# Patient Record
Sex: Male | Born: 1950 | Race: White | Hispanic: No | Marital: Married | Smoking: Never smoker
Health system: Southern US, Community
[De-identification: ages and names within clinical notes are randomized; demographics above are authoritative.]

## PROBLEM LIST (undated history)

## (undated) DIAGNOSIS — I10 Essential (primary) hypertension: Secondary | ICD-10-CM

## (undated) DIAGNOSIS — K5792 Diverticulitis of intestine, part unspecified, without perforation or abscess without bleeding: Secondary | ICD-10-CM

## (undated) DIAGNOSIS — I219 Acute myocardial infarction, unspecified: Secondary | ICD-10-CM

## (undated) HISTORY — PX: CARDIAC SURGERY: SHX584

---

## 2019-08-26 ENCOUNTER — Encounter (HOSPITAL_BASED_OUTPATIENT_CLINIC_OR_DEPARTMENT_OTHER): Payer: Self-pay | Admitting: Emergency Medicine

## 2019-08-26 ENCOUNTER — Emergency Department (HOSPITAL_BASED_OUTPATIENT_CLINIC_OR_DEPARTMENT_OTHER): Payer: BLUE CROSS/BLUE SHIELD

## 2019-08-26 ENCOUNTER — Other Ambulatory Visit: Payer: Self-pay

## 2019-08-26 ENCOUNTER — Emergency Department (HOSPITAL_BASED_OUTPATIENT_CLINIC_OR_DEPARTMENT_OTHER)
Admission: EM | Admit: 2019-08-26 | Discharge: 2019-08-27 | Discharge status: Against Medical Advice | Payer: BLUE CROSS/BLUE SHIELD | Attending: Internal Medicine | Admitting: Internal Medicine

## 2019-08-26 DIAGNOSIS — R202 Paresthesia of skin: Secondary | ICD-10-CM | POA: Diagnosis not present

## 2019-08-26 DIAGNOSIS — R531 Weakness: Secondary | ICD-10-CM | POA: Diagnosis not present

## 2019-08-26 DIAGNOSIS — Z20822 Contact with and (suspected) exposure to covid-19: Secondary | ICD-10-CM | POA: Diagnosis not present

## 2019-08-26 DIAGNOSIS — H538 Other visual disturbances: Secondary | ICD-10-CM | POA: Insufficient documentation

## 2019-08-26 DIAGNOSIS — Z5321 Procedure and treatment not carried out due to patient leaving prior to being seen by health care provider: Secondary | ICD-10-CM | POA: Insufficient documentation

## 2019-08-26 DIAGNOSIS — R2990 Unspecified symptoms and signs involving the nervous system: Secondary | ICD-10-CM

## 2019-08-26 DIAGNOSIS — I639 Cerebral infarction, unspecified: Secondary | ICD-10-CM | POA: Diagnosis present

## 2019-08-26 HISTORY — DX: Acute myocardial infarction, unspecified: I21.9

## 2019-08-26 LAB — DIFFERENTIAL
Abs Immature Granulocytes: 0.03 10*3/uL (ref 0.00–0.07)
Basophils Absolute: 0.1 10*3/uL (ref 0.0–0.1)
Basophils Relative: 1 %
Eosinophils Absolute: 0.2 10*3/uL (ref 0.0–0.5)
Eosinophils Relative: 3 %
Immature Granulocytes: 0 %
Lymphocytes Relative: 14 %
Lymphs Abs: 1 10*3/uL (ref 0.7–4.0)
Monocytes Absolute: 0.8 10*3/uL (ref 0.1–1.0)
Monocytes Relative: 11 %
Neutro Abs: 5 10*3/uL (ref 1.7–7.7)
Neutrophils Relative %: 71 %

## 2019-08-26 LAB — RAPID URINE DRUG SCREEN, HOSP PERFORMED
Amphetamines: NOT DETECTED
Barbiturates: NOT DETECTED
Benzodiazepines: NOT DETECTED
Cocaine: NOT DETECTED
Opiates: NOT DETECTED
Tetrahydrocannabinol: NOT DETECTED

## 2019-08-26 LAB — CBC
HCT: 38.9 % — ABNORMAL LOW (ref 39.0–52.0)
Hemoglobin: 12.7 g/dL — ABNORMAL LOW (ref 13.0–17.0)
MCH: 27.1 pg (ref 26.0–34.0)
MCHC: 32.6 g/dL (ref 30.0–36.0)
MCV: 83.1 fL (ref 80.0–100.0)
Platelets: 238 10*3/uL (ref 150–400)
RBC: 4.68 MIL/uL (ref 4.22–5.81)
RDW: 15.2 % (ref 11.5–15.5)
WBC: 7.1 10*3/uL (ref 4.0–10.5)
nRBC: 0 % (ref 0.0–0.2)

## 2019-08-26 LAB — URINALYSIS, ROUTINE W REFLEX MICROSCOPIC
Bilirubin Urine: NEGATIVE
Glucose, UA: NEGATIVE mg/dL
Hgb urine dipstick: NEGATIVE
Ketones, ur: NEGATIVE mg/dL
Leukocytes,Ua: NEGATIVE
Nitrite: NEGATIVE
Protein, ur: NEGATIVE mg/dL
Specific Gravity, Urine: 1.025 (ref 1.005–1.030)
pH: 6 (ref 5.0–8.0)

## 2019-08-26 LAB — COMPREHENSIVE METABOLIC PANEL
ALT: 32 U/L (ref 0–44)
AST: 34 U/L (ref 15–41)
Albumin: 3.6 g/dL (ref 3.5–5.0)
Alkaline Phosphatase: 159 U/L — ABNORMAL HIGH (ref 38–126)
Anion gap: 9 (ref 5–15)
BUN: 15 mg/dL (ref 8–23)
CO2: 25 mmol/L (ref 22–32)
Calcium: 8.9 mg/dL (ref 8.9–10.3)
Chloride: 103 mmol/L (ref 98–111)
Creatinine, Ser: 0.8 mg/dL (ref 0.61–1.24)
GFR calc Af Amer: >60 mL/min (ref >60)
GFR calc non Af Amer: >60 mL/min (ref >60)
Glucose, Bld: 115 mg/dL — ABNORMAL HIGH (ref 70–99)
Potassium: 3.5 mmol/L (ref 3.5–5.1)
Sodium: 137 mmol/L (ref 135–145)
Total Bilirubin: 2.7 mg/dL — ABNORMAL HIGH (ref 0.3–1.2)
Total Protein: 7.3 g/dL (ref 6.5–8.1)

## 2019-08-26 LAB — PROTIME-INR
INR: 1.5 — ABNORMAL HIGH (ref 0.8–1.2)
Prothrombin Time: 17.1 seconds — ABNORMAL HIGH (ref 11.4–15.2)

## 2019-08-26 LAB — SARS CORONAVIRUS 2 BY RT PCR (HOSPITAL ORDER, PERFORMED IN ~~LOC~~ HOSPITAL LAB): SARS Coronavirus 2: NEGATIVE

## 2019-08-26 LAB — ETHANOL: Alcohol, Ethyl (B): <10 mg/dL (ref <10)

## 2019-08-26 LAB — CBG MONITORING, ED: Glucose-Capillary: 114 mg/dL — ABNORMAL HIGH (ref 70–99)

## 2019-08-26 LAB — APTT: aPTT: 41 seconds — ABNORMAL HIGH (ref 24–36)

## 2019-08-26 MED ORDER — IOHEXOL 350 MG/ML SOLN
100.0000 mL | Freq: Once | INTRAVENOUS | Status: AC | PRN
  Administered 2019-08-26: 100 mL via INTRAVENOUS

## 2019-08-26 NOTE — ED Provider Notes (Signed)
Emergency Department Provider Note   I have reviewed the triage vital signs and the nursing notes.   HISTORY  Chief Complaint Numbness and Blurred Vision   HPI Tommy Brooks is a 69 y.o. male with PMH reviewed below presents to the ED with stroke like symptoms. He presents to the ED POV with wife. LSN at 5 PM yesterday.  Patient states that symptoms seem to happen gradually yesterday evening.  He felt numbness in his face worse on the left with vision change in his left eye.  He describes a blurry/double type vision in the left.  Patient states that before going to bed he had some difficulty walking but did not appreciate any clear weakness on the left although was feeling abnormal.  He went to bed around midnight.  This morning when he woke up he could essentially not walk.  He tried walking and was having to go from side to side in the hallway and nearly fell down.  The wife states that his voice also seems slightly different than before.  Patient states that since this morning his symptoms have improved slightly but remain present.  He is not having headache.  He has no prior history of stroke.  He has been vaccinated for COVID and is not having any respiratory symptoms.  Past Medical History:  Diagnosis Date  . MI (myocardial infarction) [facility])     Patient Active Problem List   Diagnosis Date Noted  . Acute cerebrovascular accident (CVA) (HCC) 08/26/2019   Allergies Patient has no known allergies.  No family history on file.  Social History Social History   Tobacco Use  . Smoking status: Not on file  Substance Use Topics  . Alcohol use: Not on file  . Drug use: Not on file    Review of Systems  Constitutional: No fever/chills Eyes: Positive left eye visual changes. ENT: No sore throat. Cardiovascular: Denies chest pain. Respiratory: Denies shortness of breath. Gastrointestinal: No abdominal pain.  No nausea, no vomiting.  No diarrhea.  No  constipation. Genitourinary: Negative for dysuria. Musculoskeletal: Negative for back pain. Skin: Negative for rash. Neurological: Negative for headaches. Positive left face numbness with LUE and LLE weakness.   10-point ROS otherwise negative.  ____________________________________________   PHYSICAL EXAM:  VITAL SIGNS: ED Triage Vitals  Enc Vitals Group     BP 08/26/19 1607 126/90     Pulse Rate 08/26/19 1607 85     Resp 08/26/19 1607 20     Temp --      Temp src --      SpO2 08/26/19 1607 98 %     Weight 08/26/19 1608 (!) 301 lb 5.9 oz (136.7 kg)     Height 08/26/19 1608 5\' 10"  (1.778 m)   Constitutional: Alert and oriented. Well appearing and in no acute distress. Eyes: Conjunctivae are normal. PERRL.  Patient has visual field deficit in the left lateral vision with left lateral eye movement becoming more staccato.  No clear nystagmus but extraocular movement is not smooth.  Head: Atraumatic. Nose: No congestion/rhinnorhea. Mouth/Throat: Mucous membranes are moist.  Neck: No stridor.   Cardiovascular: Normal rate, regular rhythm. Good peripheral circulation. Grossly normal heart sounds.   Respiratory: Normal respiratory effort.  No retractions. Lungs CTAB. Gastrointestinal: Soft and nontender. No distention.  Musculoskeletal: No lower extremity tenderness nor edema. No gross deformities of extremities. Neurologic:  Normal speech and language.  Subjective decreased sensation in the left face compared to the right.  Patient  has 4 out of 5 grip strength in the left with very mild left upper extremity drift.  Left lower extremity is 4+ out of 5 strength with normal sensation.  Finger-to-nose testing is abnormal with both eyes open but when the patient closes his left eye he has smooth finger-to-nose movement with both upper extremities along with normal heel-to-shin testing bilaterally Skin:  Skin is warm, dry and intact. No rash  noted.   ____________________________________________   LABS (all labs ordered are listed, but only abnormal results are displayed)  Labs Reviewed  PROTIME-INR - Abnormal; Notable for the following components:      Result Value   Prothrombin Time 17.1 (*)    INR 1.5 (*)    All other components within normal limits  APTT - Abnormal; Notable for the following components:   aPTT 41 (*)    All other components within normal limits  CBC - Abnormal; Notable for the following components:   Hemoglobin 12.7 (*)    HCT 38.9 (*)    All other components within normal limits  COMPREHENSIVE METABOLIC PANEL - Abnormal; Notable for the following components:   Glucose, Bld 115 (*)    Alkaline Phosphatase 159 (*)    Total Bilirubin 2.7 (*)    All other components within normal limits  CBG MONITORING, ED - Abnormal; Notable for the following components:   Glucose-Capillary 114 (*)    All other components within normal limits  SARS CORONAVIRUS 2 BY RT PCR (HOSPITAL ORDER, PERFORMED IN [facility])  ETHANOL  DIFFERENTIAL  RAPID URINE DRUG SCREEN, HOSP PERFORMED  URINALYSIS, ROUTINE W REFLEX MICROSCOPIC   ____________________________________________  EKG   EKG Interpretation  Date/Time:  Wednesday August 26 2019 16:08:50 EDT Ventricular Rate:  95 PR Interval:    QRS Duration: 155 QT Interval:  407 QTC Calculation: 512 R Axis:   65 Text Interpretation: Atrial fibrillation Right bundle branch block No old tracing for comparison. No STEMI Confirmed by Alona Bene [id number]) on 08/26/2019 4:29:11 PM Also confirmed by Alona Bene [phone]), editor [place], LaVerne ([id number])  on 08/27/2019 9:31:02 AM       ____________________________________________  RADIOLOGY  CTA head/neck reviewed.  ____________________________________________   PROCEDURES  Procedure(s) performed:   Procedures  CRITICAL CARE Performed by: Maia Plan Total critical care time: 35  minutes Critical care time was exclusive of separately billable procedures and treating other patients. Critical care was necessary to treat or prevent imminent or life-threatening deterioration. Critical care was time spent personally by me on the following activities: development of treatment plan with patient and/or surrogate as well as nursing, discussions with consultants, evaluation of patient's response to treatment, examination of patient, obtaining history from patient or surrogate, ordering and performing treatments and interventions, ordering and review of laboratory studies, ordering and review of radiographic studies, pulse oximetry and re-evaluation of patient's condition.  Alona Bene, MD Emergency Medicine  ____________________________________________   INITIAL IMPRESSION / ASSESSMENT AND PLAN / ED COURSE  Pertinent labs & imaging results that were available during my care of the patient were reviewed by me and considered in my medical decision making (see chart for details).   Patient presents to the emergency department for evaluation 23 hours after last seen normal with strokelike symptoms.  Patient is having mainly left-sided deficits with a vision change on the left.  He is outside of the tPA window. Will send for urgent CTA head/neck along with stroke labs.   CTA head and neck reviewed. No  acute findings.   Discussed patient's case with [facility] to request admission. Patient and family (if present) updated with plan. Care transferred to [facility] service.  I reviewed all nursing notes, vitals, pertinent old records, EKGs, labs, imaging (as available).  ____________________________________________  FINAL CLINICAL IMPRESSION(S) / ED DIAGNOSES  Final diagnoses:  Stroke-like symptoms     MEDICATIONS GIVEN DURING THIS VISIT:  Medications  iohexol (OMNIPAQUE) 350 MG/ML injection 100 mL (100 mLs Intravenous Contrast Given 08/26/19 1707)     Note:  This document was prepared  using Dragon voice recognition software and may include unintentional dictation errors.  Alona Bene, MD, [facility] Emergency Medicine    Duwayne Matters, Arlyss Repress, MD 08/31/19 [phone]

## 2019-08-26 NOTE — ED Notes (Signed)
Tele neurologist at bedside with this RN

## 2019-08-26 NOTE — Consult Note (Signed)
[facility] TeleNeurology Consult Services  Stat Consult  Date of Service:   08/26/2019 17:48:22  Impression:     .  I63.9 - Cerebrovascular accident (CVA), unspecified mechanism (HCC)  Comments/Sign-Out: 69 y/o man presenting with multiple symptoms concerning for stroke in setting of subtherapeutic INR (has afib and artificial valves). Recommend MRI to help assess size of stroke which will guide recs on whether warfarin needs to be held temporarily.  CT HEAD: Reviewed left basal ganglia hypodensity  Metrics: [facility] Notification Time: 08/26/2019 17:46:32 Stamp Time: 08/26/2019 17:48:22 Callback Response Time: 08/26/2019 17:49:03  Our recommendations are outlined below.  Imaging Studies:     .  MRI Head Without Contrast  Therapies:     .  Physical Therapy, Occupational Therapy, Speech Therapy Assessment When Applicable  Disposition: Neurology Follow Up Recommended  Sign Out:     .  Discussed with Emergency Department Provider  ----------------------------------------------------------------------------------------------------  Chief Complaint: left-sided weakness, vision loss, dizziness  History of Present Illness: Patient is a 69 year old Male.  Last night he noticed sudden onset of trouble standing, whole face went numb and couldn't see out of his left eye and left side felt weak and he was feeling dizzy and having trouble understanding what his wife was saying to him. His wife noticed his speech was slurred. When he got up today he was able to walk a little better but still having some numbness in his face and still can't see well out of his left eye. Has some vertigo and diplopia when he opens his left eye. INR is subtherapeutic at 1.5 today.    Past Medical History:     . Hypertension     . Atrial Fibrillation     . Coronary Artery Disease     . There is NO history of Diabetes Mellitus     . There is NO history of Hyperlipidemia      . There is NO history of Stroke  Anticoagulant use:  warfarin  Antiplatelet use: No     Examination: BP(124/93), Pulse(64), Blood Glucose(115) 1A: Level of Consciousness - Alert; keenly responsive + 0 1B: Ask Month and Age - Both Questions Right + 0 1C: Blink Eyes & Squeeze Hands - Performs Both Tasks + 0 2: Test Horizontal Extraocular Movements - Normal + 0 3: Test Visual Fields - Complete Hemianopia + 2 4: Test Facial Palsy (Use Grimace if Obtunded) - Normal symmetry + 0 5A: Test Left Arm Motor Drift - No Drift for 10 Seconds + 0 5B: Test Right Arm Motor Drift - No Drift for 10 Seconds + 0 6A: Test Left Leg Motor Drift - No Drift for 5 Seconds + 0 6B: Test Right Leg Motor Drift - No Drift for 5 Seconds + 0 7: Test Limb Ataxia (FNF/Heel-Shin) - No Ataxia + 0 8: Test Sensation - Mild-Moderate Loss: Less Sharp/More Dull + 1 9: Test Language/Aphasia - Normal; No aphasia + 0 10: Test Dysarthria - Normal + 0 11: Test Extinction/Inattention - No abnormality + 0  NIHSS Score: 3   Patient/Family was informed the Neurology Consult would occur via TeleHealth consult by way of interactive audio and video telecommunications and consented to receiving care in this manner.  Patient is being evaluated for possible acute neurologic impairment and high probability of imminent or life-threatening deterioration. I spent total of 25 minutes providing care to this patient, including time for face to face visit via telemedicine, review of medical records, imaging studies and discussion of findings  with providers, the patient and/or family.   Dr Sherilyn Cooter   [facility] [phone]  Case [id number]

## 2019-08-26 NOTE — ED Notes (Signed)
Patient transported to CT 

## 2019-08-26 NOTE — ED Triage Notes (Addendum)
PT states midnight, left face numbness, now entire face is numb . Left eye blurred and poor movement. Weakness on left side. MD in room. Last well know time 1700 yesterday. Per wife.

## 2019-08-26 NOTE — ED Notes (Signed)
Pt has decreased strength in left leg and arm. States entire face feels numb. No facial palsy. Pt unable to see peripheral vision in left eye, right side intact.

## 2019-08-26 NOTE — ED Notes (Signed)
ED Provider at bedside. 

## 2019-08-27 MED ORDER — RAMIPRIL 5 MG PO CAPS
5.0000 mg | ORAL_CAPSULE | Freq: Every day | ORAL | Status: DC
  Administered 2019-08-27: 5 mg via ORAL
  Filled 2019-08-27: qty 1

## 2019-08-27 MED ORDER — ALLOPURINOL 300 MG PO TABS
300.0000 mg | ORAL_TABLET | Freq: Every day | ORAL | Status: DC
  Filled 2019-08-27: qty 1

## 2019-08-27 MED ORDER — ATORVASTATIN CALCIUM 40 MG PO TABS
80.0000 mg | ORAL_TABLET | Freq: Every day | ORAL | Status: DC
  Administered 2019-08-27: 80 mg via ORAL

## 2019-08-27 MED ORDER — WARFARIN - PHYSICIAN DOSING INPATIENT
Freq: Every day | Status: DC
  Filled 2019-08-27: qty 1

## 2019-08-27 MED ORDER — CARVEDILOL 6.25 MG PO TABS
3.1250 mg | ORAL_TABLET | Freq: Two times a day (BID) | ORAL | Status: DC
  Administered 2019-08-27: 3.125 mg via ORAL

## 2019-08-27 MED ORDER — WARFARIN SODIUM 5 MG PO TABS
8.0000 mg | ORAL_TABLET | Freq: Every day | ORAL | Status: DC
  Administered 2019-08-27: 6 mg via ORAL

## 2019-08-27 MED ORDER — BUMETANIDE 2 MG PO TABS
2.0000 mg | ORAL_TABLET | Freq: Every day | ORAL | Status: DC
  Filled 2019-08-27: qty 1

## 2019-08-27 NOTE — ED Notes (Signed)
Pt insisting that he go home.  Dr. Deretha Emory notified and he has spoken to patient regarding AMA status.  Pt left department with wife, ambulated well.

## 2019-08-27 NOTE — ED Provider Notes (Signed)
Patient wants to leave AMA.  Explained to him earlier the significant risk of leaving when there is concern for stroke.  There is a very high risk for recurrent stroke or worsening symptoms to occur.  And admissions normally so that she can be monitored in case that happens in the hospital where he may not realize it is happening.  That he can receive immediate treatment.  Patient was admitted and awaiting admission for stroke concerns.  Patient understands all this and still wants to leave AMA.   Vanetta Mulders, MD 08/27/19 1408

## 2021-07-26 IMAGING — CT CT ANGIO NECK
1 of 14 series · 3 of 33 positions shown · IV contrast (omnipaque)
Comparison: None.

CLINICAL DATA: Left face numbness. Left eye blurred vision.
Left-sided weakness.

EXAM:
CT ANGIOGRAPHY HEAD AND NECK
TECHNIQUE: Multidetector CT imaging of the head and neck was performed using
the standard protocol during bolus administration of intravenous
contrast. Multiplanar CT image reconstructions and MIPs were
obtained to evaluate the vascular anatomy. Carotid stenosis
measurements (when applicable) are obtained utilizing NASCET
criteria, using the distal internal carotid diameter as the
denominator.
CONTRAST:  100mL OMNIPAQUE IOHEXOL 350 MG/ML SOLN

[Series 12: axial thin · axial · 0.46mm/px · z∈[+168,+507]mm · 3 of 340 slices shown]
[im 1/340  soft-tissue]
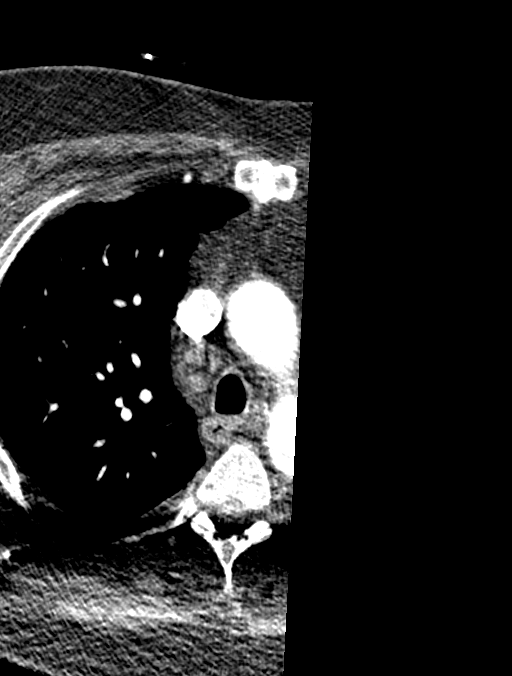
[im 170/340  bone]
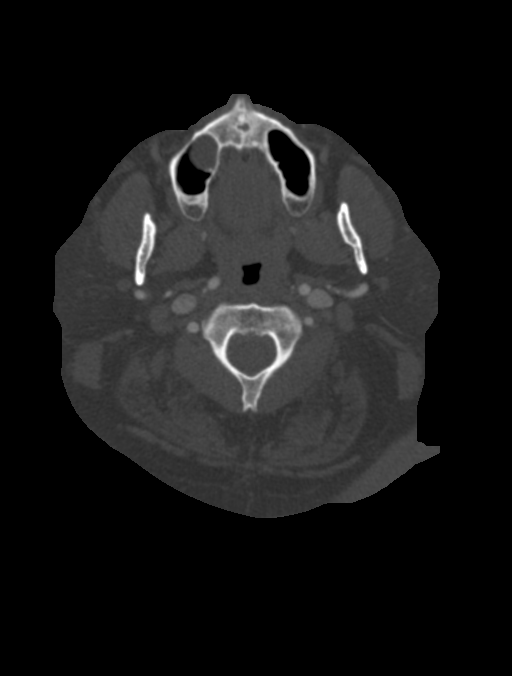
[im 340/340  soft-tissue]
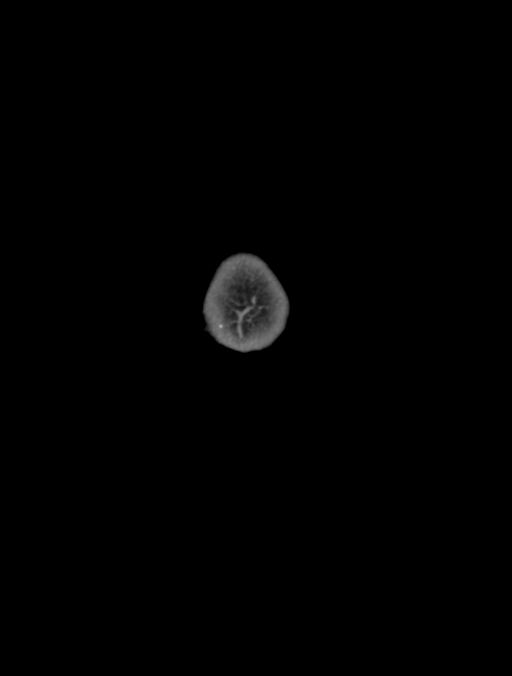

[3 of 33 positions shown; findings below may reference images not displayed]

FINDINGS: CT HEAD FINDINGS

Brain: There is an age indeterminate lacunar infarct at the anterior
aspect of the left basal ganglia. No acute cortically based infarct,
intracranial hemorrhage, mass, midline shift, or extra-axial fluid
collection is identified. Patchy hypodensities in the cerebral white
matter bilaterally are nonspecific but compatible with moderate
chronic small vessel ischemic disease. Mild cerebral atrophy is
within normal limits for age.

Vascular: Calcified atherosclerosis at the skull base. No hyperdense
vessel.

Skull: No fracture or suspicious osseous lesion.

Sinuses: Visualized paranasal sinuses and mastoid air cells are
clear.

Orbits: Unremarkable.

Review of the MIP images confirms the above findings

CTA NECK FINDINGS

Aortic arch: Standard 3 vessel aortic arch with widely patent arch
vessel origins.

Right carotid system: Patent without evidence of stenosis or
dissection.

Left carotid system: Patent without evidence of stenosis or
dissection.

Vertebral arteries: Patent without evidence of stenosis or
dissection. Moderately dominant right vertebral artery.

Skeleton: Mild cervical spondylosis.

Other neck: No evidence of cervical lymphadenopathy or mass.

Upper chest: Clear lung apices.

Review of the MIP images confirms the above findings

CTA HEAD FINDINGS

Anterior circulation: The internal carotid arteries are patent from
skull base to carotid termini with mild atherosclerotic plaque
bilaterally not resulting in significant stenosis. ACAs and MCAs are
patent without evidence of a proximal branch occlusion or
significant proximal stenosis. No aneurysm is identified.

Posterior circulation: The intracranial vertebral arteries are
widely patent to the basilar. Patent PICA and SCA origins are seen
bilaterally. Basilar artery is widely patent. Posterior
communicating arteries are not identified and may be diminutive or
absent. The PCAs are patent without evidence of a significant
proximal stenosis. No aneurysm is identified.

Venous sinuses: Patent.

Anatomic variants: None.

Review of the MIP images confirms the above findings
IMPRESSION: 1. Age indeterminate lacunar infarct at the anterior aspect of the
left basal ganglia.
2. Moderate chronic small vessel ischemic disease.
3. Mild intracranial atherosclerosis without large vessel occlusion
or significant proximal stenosis.
4. Widely patent cervical carotid and vertebral arteries.

## 2022-01-16 ENCOUNTER — Other Ambulatory Visit: Payer: Self-pay

## 2022-01-16 ENCOUNTER — Emergency Department (HOSPITAL_BASED_OUTPATIENT_CLINIC_OR_DEPARTMENT_OTHER): Payer: BLUE CROSS/BLUE SHIELD

## 2022-01-16 ENCOUNTER — Emergency Department (HOSPITAL_BASED_OUTPATIENT_CLINIC_OR_DEPARTMENT_OTHER)
Admission: EM | Admit: 2022-01-16 | Discharge: 2022-01-16 | Disposition: A | Discharge status: Home / Self Care | Payer: BLUE CROSS/BLUE SHIELD | Attending: Emergency Medicine | Admitting: Emergency Medicine

## 2022-01-16 ENCOUNTER — Encounter (HOSPITAL_BASED_OUTPATIENT_CLINIC_OR_DEPARTMENT_OTHER): Payer: Self-pay

## 2022-01-16 DIAGNOSIS — D72829 Elevated white blood cell count, unspecified: Secondary | ICD-10-CM | POA: Insufficient documentation

## 2022-01-16 DIAGNOSIS — K5732 Diverticulitis of large intestine without perforation or abscess without bleeding: Secondary | ICD-10-CM

## 2022-01-16 DIAGNOSIS — I10 Essential (primary) hypertension: Secondary | ICD-10-CM | POA: Diagnosis not present

## 2022-01-16 DIAGNOSIS — Z79899 Other long term (current) drug therapy: Secondary | ICD-10-CM | POA: Diagnosis not present

## 2022-01-16 DIAGNOSIS — R1032 Left lower quadrant pain: Secondary | ICD-10-CM | POA: Diagnosis present

## 2022-01-16 DIAGNOSIS — R748 Abnormal levels of other serum enzymes: Secondary | ICD-10-CM | POA: Insufficient documentation

## 2022-01-16 HISTORY — DX: Essential (primary) hypertension: I10

## 2022-01-16 LAB — CBC WITH DIFFERENTIAL/PLATELET
Abs Immature Granulocytes: 0.06 10*3/uL (ref 0.00–0.07)
Basophils Absolute: 0.1 10*3/uL (ref 0.0–0.1)
Basophils Relative: 1 %
Eosinophils Absolute: 0.2 10*3/uL (ref 0.0–0.5)
Eosinophils Relative: 2 %
HCT: 43 % (ref 39.0–52.0)
Hemoglobin: 14.2 g/dL (ref 13.0–17.0)
Immature Granulocytes: 1 %
Lymphocytes Relative: 7 %
Lymphs Abs: 0.8 10*3/uL (ref 0.7–4.0)
MCH: 27.2 pg (ref 26.0–34.0)
MCHC: 33 g/dL (ref 30.0–36.0)
MCV: 82.2 fL (ref 80.0–100.0)
Monocytes Absolute: 1.2 10*3/uL — ABNORMAL HIGH (ref 0.1–1.0)
Monocytes Relative: 10 %
Neutro Abs: 9.6 10*3/uL — ABNORMAL HIGH (ref 1.7–7.7)
Neutrophils Relative %: 79 %
Platelets: 240 10*3/uL (ref 150–400)
RBC: 5.23 MIL/uL (ref 4.22–5.81)
RDW: 14.6 % (ref 11.5–15.5)
WBC: 12 10*3/uL — ABNORMAL HIGH (ref 4.0–10.5)
nRBC: 0 % (ref 0.0–0.2)

## 2022-01-16 LAB — COMPREHENSIVE METABOLIC PANEL
ALT: 34 U/L (ref 0–44)
AST: 40 U/L (ref 15–41)
Albumin: 3.8 g/dL (ref 3.5–5.0)
Alkaline Phosphatase: 154 U/L — ABNORMAL HIGH (ref 38–126)
Anion gap: 11 (ref 5–15)
BUN: 16 mg/dL (ref 8–23)
CO2: 23 mmol/L (ref 22–32)
Calcium: 9.1 mg/dL (ref 8.9–10.3)
Chloride: 100 mmol/L (ref 98–111)
Creatinine, Ser: 0.89 mg/dL (ref 0.61–1.24)
GFR, Estimated: >60 mL/min (ref >60)
Glucose, Bld: 110 mg/dL — ABNORMAL HIGH (ref 70–99)
Potassium: 3.9 mmol/L (ref 3.5–5.1)
Sodium: 134 mmol/L — ABNORMAL LOW (ref 135–145)
Total Bilirubin: 3.1 mg/dL — ABNORMAL HIGH (ref 0.3–1.2)
Total Protein: 8.4 g/dL — ABNORMAL HIGH (ref 6.5–8.1)

## 2022-01-16 LAB — URINALYSIS, ROUTINE W REFLEX MICROSCOPIC
Bilirubin Urine: NEGATIVE
Glucose, UA: NEGATIVE mg/dL
Hgb urine dipstick: NEGATIVE
Ketones, ur: NEGATIVE mg/dL
Leukocytes,Ua: NEGATIVE
Nitrite: NEGATIVE
Protein, ur: 30 mg/dL — AB
Specific Gravity, Urine: 1.02 (ref 1.005–1.030)
pH: 6.5 (ref 5.0–8.0)

## 2022-01-16 LAB — URINALYSIS, MICROSCOPIC (REFLEX)
Bacteria, UA: NONE SEEN
RBC / HPF: NONE SEEN RBC/hpf (ref 0–5)

## 2022-01-16 LAB — LIPASE, BLOOD: Lipase: 59 U/L — ABNORMAL HIGH (ref 11–51)

## 2022-01-16 MED ORDER — ONDANSETRON HCL 4 MG/2ML IJ SOLN
4.0000 mg | Freq: Once | INTRAMUSCULAR | Status: AC
  Administered 2022-01-16: 4 mg via INTRAVENOUS
  Filled 2022-01-16: qty 2

## 2022-01-16 MED ORDER — ONDANSETRON 4 MG PO TBDP: 4.0000 mg | ORAL_TABLET | Freq: Three times a day (TID) | ORAL | 0 refills | Status: DC | PRN

## 2022-01-16 MED ORDER — HYDROCODONE-ACETAMINOPHEN 5-325 MG PO TABS: 1.0000 | ORAL_TABLET | Freq: Four times a day (QID) | ORAL | 0 refills | Status: DC | PRN

## 2022-01-16 MED ORDER — AMOXICILLIN-POT CLAVULANATE 875-125 MG PO TABS: 1.0000 | ORAL_TABLET | Freq: Two times a day (BID) | ORAL | 0 refills | Status: DC

## 2022-01-16 MED ORDER — MORPHINE SULFATE (PF) 4 MG/ML IV SOLN
4.0000 mg | Freq: Once | INTRAVENOUS | Status: AC
  Administered 2022-01-16: 4 mg via INTRAVENOUS
  Filled 2022-01-16: qty 1

## 2022-01-16 NOTE — ED Notes (Signed)
Pt stable at time of discharge. RR even and unlabored. No distress noted. Pt verbalized understanding of discharge instructions as discussed. Pt ambulatory at time of discharge.  

## 2022-01-16 NOTE — ED Triage Notes (Signed)
C/o left flank pain radiating to abdomen x 3 days. Denies urinary symptoms or N/V.  Hx of kidney stones states feels similar.

## 2022-01-16 NOTE — Discharge Instructions (Addendum)
Your workup today showed you have diverticulitis.  No concerning features surrounding this.  I have sent antibiotic into the pharmacy along with nausea medication as well as pain medication.  Try to utilize Tylenol and other pain medicine before taking the narcotic pain medicine.  For any worsening symptoms please return to the emergency room otherwise follow-up with your PCP after this episode resolves.

## 2022-01-16 NOTE — ED Provider Notes (Signed)
[facility] EMERGENCY DEPARTMENT Provider Note   CSN: [id number] Arrival date & time: 01/16/22  1602     History  Chief Complaint  Patient presents with   Flank Pain    Tommy Brooks is a 72 y.o. male.  72 year old male with past medical history of kidney stones presents today for evaluation of left-sided flank pain which has been intermittent until today when it became constant.  Denies dysuria, hematuria, fever.  Has not taken anything for pain prior to arrival.  Does appear uncomfortable on exam.  Denies nausea, vomiting.  The history is provided by the patient. No language interpreter was used.       Home Medications Prior to Admission medications   Medication Sig Start Date End Date Taking? Authorizing Provider  allopurinol (ZYLOPRIM) 300 MG tablet Take 300 mg by mouth daily. 08/04/19   [provider]  atorvastatin (LIPITOR) 80 MG tablet Take 80 mg by mouth at bedtime. 08/23/19   [provider]  bumetanide (BUMEX) 2 MG tablet Take 2 mg by mouth daily. 08/13/19   [provider]  carvedilol (COREG) 3.125 MG tablet Take 3.125 mg by mouth 2 (two) times daily. 05/20/19   [provider]  ramipril (ALTACE) 5 MG capsule Take 5 mg by mouth daily. 08/20/19   [provider]  warfarin (COUMADIN) 4 MG tablet Take 8 mg by mouth at bedtime. 08/12/19   [provider]      Allergies    Patient has no known allergies.    Review of Systems   Review of Systems  Constitutional:  Negative for chills and fever.  Gastrointestinal:  Negative for abdominal pain, nausea and vomiting.  Genitourinary:  Positive for flank pain. Negative for dysuria and frequency.  Neurological:  Negative for light-headedness.  All other systems reviewed and are negative.   Physical Exam Updated Vital Signs BP 129/74 (BP Location: Right Arm)   Pulse 87   Temp 98.6 F (37 C) (Oral)   Resp (!) 22   Ht 5\' 10"  (1.778 m)   Wt 122.5 kg   SpO2 97%    BMI 38.74 kg/m  Physical Exam Vitals and nursing note reviewed.  Constitutional:      General: He is not in acute distress.    Appearance: Normal appearance. He is not ill-appearing.  HENT:     Head: Normocephalic and atraumatic.     Nose: Nose normal.  Eyes:     General: No scleral icterus.    Extraocular Movements: Extraocular movements intact.     Conjunctiva/sclera: Conjunctivae normal.  Cardiovascular:     Rate and Rhythm: Normal rate and regular rhythm.     Pulses: Normal pulses.  Pulmonary:     Effort: Pulmonary effort is normal. No respiratory distress.     Breath sounds: Normal breath sounds. No wheezing or rales.  Abdominal:     General: There is no distension.     Palpations: Abdomen is soft.     Tenderness: There is abdominal tenderness. There is left CVA tenderness. There is no right CVA tenderness or guarding.  Musculoskeletal:        General: Normal range of motion.     Cervical back: Normal range of motion.  Skin:    General: Skin is warm and dry.  Neurological:     General: No focal deficit present.     Mental Status: He is alert. Mental status is at baseline.     ED Results / Procedures /  Treatments   Labs (all labs ordered are listed, but only abnormal results are displayed) Labs Reviewed  CBC WITH DIFFERENTIAL/PLATELET  COMPREHENSIVE METABOLIC PANEL  LIPASE, BLOOD  URINALYSIS, ROUTINE W REFLEX MICROSCOPIC    EKG None  Radiology No results found.  Procedures Procedures    Medications Ordered in ED Medications  morphine (PF) 4 MG/ML injection 4 mg (has no administration in time range)  ondansetron (ZOFRAN) injection 4 mg (has no administration in time range)    ED Course/ Medical Decision Making/ A&P                           Medical Decision Making Amount and/or Complexity of Data Reviewed Labs: ordered. Radiology: ordered.  Risk Prescription drug management.   Medical Decision Making / ED Course   This patient presents  to the ED for concern of left flank pain, left lower quadrant abdominal pain, this involves an extensive number of treatment options, and is a complaint that carries with it a high risk of complications and morbidity.  The differential diagnosis includes kidney stone, pyelonephritis, diverticulitis, pancreatitis, UTI, colitis  MDM: 72 year old male presents today for evaluation of left lower quadrant abdominal pain and left flank pain that has been intermittent for the past 2 days that became constant today.  Denies fever, blood in the stool, dysuria, hematuria, nausea, vomiting, or other complaints.  Does appear uncomfortable during exam.  Will provide morphine, Zofran, and obtain blood work, and CT renal stone study.  He does have history of kidney stones and states this feels similar. CT renal stone study shows acute diverticulitis.  No stones.  No associated perforation. CBC shows mild leukocytosis without significant left shift.   Patient seen by attending Dr. Jarold Motto who agrees with plan.  Will start patient on Augmentin.  Give short course of pain medication to keep on hand for severe breakthrough pain.  Will give Zofran as well.  Discussed follow-up with primary care provider for additional management follow-up resolution of acute flare.  CMP without renal insufficiency, electrolytes within normal limits.  Glucose of 110.  Alk phos mildly elevated at 154 other LFTs normal.  T. bili is increased to 3.1.  No jaundice or right upper quadrant abdominal pain.  UA without findings of UTI.  Lipase mildly elevated at 59 but not significantly elevated to raise suspicion for pancreatitis.  No previous history of diverticulitis.  Lab Tests: -I ordered, reviewed, and interpreted labs.   The pertinent results include:   Labs Reviewed  CBC WITH DIFFERENTIAL/PLATELET  COMPREHENSIVE METABOLIC PANEL  LIPASE, BLOOD  URINALYSIS, ROUTINE W REFLEX MICROSCOPIC      EKG  EKG Interpretation  Date/Time:     Ventricular Rate:    PR Interval:    QRS Duration:   QT Interval:    QTC Calculation:   R Axis:     Text Interpretation:           Imaging Studies ordered: I ordered imaging studies including CT renal stone study I independently visualized and interpreted imaging. I agree with the radiologist interpretation   Medicines ordered and prescription drug management: Meds ordered this encounter  Medications   morphine (PF) 4 MG/ML injection 4 mg   ondansetron (ZOFRAN) injection 4 mg    -I have reviewed the patients home medicines and have made adjustments as needed  Critical interventions Morphine and Zofran   Reevaluation: After the interventions noted above, I reevaluated the patient and found that  they have :improved  Co morbidities that complicate the patient evaluation  Past Medical History:  Diagnosis Date   Hypertension    MI (myocardial infarction) (HCC)       Dispostion: Patient is appropriate for discharge.  Discharged in stable condition.  Return precaution discussed.  Patient voices understanding and is in agreement with plan.   Final Clinical Impression(s) / ED Diagnoses Final diagnoses:  Diverticulitis of large intestine without perforation or abscess, unspecified bleeding status    Rx / DC Orders ED Discharge Orders          Ordered    amoxicillin-clavulanate (AUGMENTIN) 875-125 MG tablet  Every 12 hours        01/16/22 1742    ondansetron (ZOFRAN-ODT) 4 MG disintegrating tablet  Every 8 hours PRN        01/16/22 1742    HYDROcodone-acetaminophen (NORCO/VICODIN) 5-325 MG tablet  Every 6 hours PRN        01/16/22 1742              Tommy Kansas, PA-C 01/16/22 1745    Rondel Baton, MD 01/16/22 1919

## 2022-01-18 ENCOUNTER — Ambulatory Visit: Payer: BLUE CROSS/BLUE SHIELD | Admitting: Family Medicine

## 2022-01-18 ENCOUNTER — Encounter: Payer: Self-pay | Admitting: Family Medicine

## 2022-01-18 DIAGNOSIS — R748 Abnormal levels of other serum enzymes: Secondary | ICD-10-CM | POA: Insufficient documentation

## 2022-01-18 DIAGNOSIS — K5792 Diverticulitis of intestine, part unspecified, without perforation or abscess without bleeding: Secondary | ICD-10-CM | POA: Diagnosis not present

## 2022-01-18 DIAGNOSIS — K5901 Slow transit constipation: Secondary | ICD-10-CM

## 2022-01-18 DIAGNOSIS — R17 Unspecified jaundice: Secondary | ICD-10-CM | POA: Insufficient documentation

## 2022-01-18 LAB — COMPREHENSIVE METABOLIC PANEL
ALT: 32 U/L (ref 0–53)
AST: 41 U/L — ABNORMAL HIGH (ref 0–37)
Albumin: 3.6 g/dL (ref 3.5–5.2)
Alkaline Phosphatase: 161 U/L — ABNORMAL HIGH (ref 39–117)
BUN: 19 mg/dL (ref 6–23)
CO2: 28 mEq/L (ref 19–32)
Calcium: 8.5 mg/dL (ref 8.4–10.5)
Chloride: 105 mEq/L (ref 96–112)
Creatinine, Ser: 0.98 mg/dL (ref 0.40–1.50)
GFR: 78.69 mL/min (ref >60.00)
Glucose, Bld: 103 mg/dL — ABNORMAL HIGH (ref 70–99)
Potassium: 3.9 mEq/L (ref 3.5–5.1)
Sodium: 139 mEq/L (ref 135–145)
Total Bilirubin: 3.2 mg/dL — ABNORMAL HIGH (ref 0.2–1.2)
Total Protein: 7.4 g/dL (ref 6.0–8.3)

## 2022-01-18 LAB — CBC WITH DIFFERENTIAL/PLATELET
Basophils Absolute: 0.1 10*3/uL (ref 0.0–0.1)
Basophils Relative: 1 % (ref 0.0–3.0)
Eosinophils Absolute: 0.3 10*3/uL (ref 0.0–0.7)
Eosinophils Relative: 3.4 % (ref 0.0–5.0)
HCT: 37.9 % — ABNORMAL LOW (ref 39.0–52.0)
Hemoglobin: 12.6 g/dL — ABNORMAL LOW (ref 13.0–17.0)
Lymphocytes Relative: 9 % — ABNORMAL LOW (ref 12.0–46.0)
Lymphs Abs: 0.8 10*3/uL (ref 0.7–4.0)
MCHC: 33.1 g/dL (ref 30.0–36.0)
MCV: 81.3 fl (ref 78.0–100.0)
Monocytes Absolute: 1.2 10*3/uL — ABNORMAL HIGH (ref 0.1–1.0)
Monocytes Relative: 13.1 % — ABNORMAL HIGH (ref 3.0–12.0)
Neutro Abs: 6.6 10*3/uL (ref 1.4–7.7)
Neutrophils Relative %: 73.5 % (ref 43.0–77.0)
Platelets: 219 10*3/uL (ref 150.0–400.0)
RBC: 4.67 Mil/uL (ref 4.22–5.81)
RDW: 15.5 % (ref 11.5–15.5)
WBC: 8.9 10*3/uL (ref 4.0–10.5)

## 2022-01-18 LAB — LIPASE: Lipase: 83 U/L — ABNORMAL HIGH (ref 11.0–59.0)

## 2022-01-18 LAB — AMYLASE: Amylase: 36 U/L (ref 27–131)

## 2022-01-18 NOTE — Progress Notes (Signed)
Established Patient Office Visit   Subjective:  Patient ID: Tommy Brooks, male    DOB: 25-Dec-1950  Age: 72 y.o. MRN: [id number]  Chief Complaint  Patient presents with   Establish Care    NP/establish care no concerns. Patient fasting, seen at urgent care few days ago diagnosed with diverticulitis.     HPI Encounter Diagnoses  Name Primary?   Diverticulitis Yes   Elevated lipase    Slow transit constipation    For follow-up status post ER visit 2 days ago for acute abdominal pain.  Diagnosed with diverticulitis.  And started on Augmentin.  He is improving.  There is some lingering pain.  He denies hematochezia, melena or fever and chills.  He is tolerating the Augmentin.  Past medical history of hypertension, ASCVD, history of CVA, atrial fibs, multiple heart valve replacements.  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  Coumadin is followed by his cardiologist in [place].  He lives between here in [place].  His primary care is in [place].  Results of CT obtained 2 days ago was reviewed.  Ongoing history of elevated alkaline phosphatase and total bilirubin.  Over the last several weeks he has developed constipation.  This seemed to start after he was advised to avoid fruits and vegetables secondary to difficulties regulating his PT and INR.   Review of Systems  Constitutional: Negative.   HENT: Negative.    Eyes:  Negative for blurred vision, discharge and redness.  Respiratory: Negative.    Cardiovascular: Negative.   Gastrointestinal:  Positive for abdominal pain and constipation. Negative for blood in stool, diarrhea and melena.  Genitourinary: Negative.   Musculoskeletal: Negative.  Negative for myalgias.  Skin:  Negative for rash.  Neurological:  Negative for tingling, loss of consciousness and weakness.  Endo/Heme/Allergies:  Negative for polydipsia.     Current Outpatient Medications:    allopurinol (ZYLOPRIM) 300 MG tablet, Take 300 mg by mouth daily., Disp: , Rfl:     amoxicillin-clavulanate (AUGMENTIN) 875-125 MG tablet, Take 1 tablet by mouth every 12 (twelve) hours., Disp: 14 tablet, Rfl: 0   atorvastatin (LIPITOR) 80 MG tablet, Take 80 mg by mouth at bedtime., Disp: , Rfl:    bumetanide (BUMEX) 2 MG tablet, Take 2 mg by mouth daily., Disp: , Rfl:    carvedilol (COREG) 3.125 MG tablet, Take 3.125 mg by mouth 2 (two) times daily., Disp: , Rfl:    HYDROcodone-acetaminophen (NORCO/VICODIN) 5-325 MG tablet, Take 1-2 tablets by mouth every 6 (six) hours as needed for severe pain., Disp: 12 tablet, Rfl: 0   ondansetron (ZOFRAN-ODT) 4 MG disintegrating tablet, Take 1 tablet (4 mg total) by mouth every 8 (eight) hours as needed., Disp: 20 tablet, Rfl: 0   ramipril (ALTACE) 5 MG capsule, Take 5 mg by mouth daily., Disp: , Rfl:    warfarin (COUMADIN) 4 MG tablet, Take 8 mg by mouth at bedtime., Disp: , Rfl:    Objective:     There were no vitals taken for this visit.   Physical Exam Constitutional:      General: He is not in acute distress.    Appearance: Normal appearance. He is not ill-appearing, toxic-appearing or diaphoretic.  HENT:     Head: Normocephalic and atraumatic.     Right Ear: External ear normal.     Left Ear: External ear normal.  Eyes:     General: No scleral icterus.       Right eye: No discharge.  Left eye: No discharge.     Extraocular Movements: Extraocular movements intact.     Conjunctiva/sclera: Conjunctivae normal.  Pulmonary:     Effort: Pulmonary effort is normal. No respiratory distress.  Abdominal:     General: Bowel sounds are normal.     Tenderness: There is abdominal tenderness. There is no guarding or rebound.     Comments: Mild tenderness to palpation in the left lower quadrant.  Skin:    General: Skin is warm and dry.  Neurological:     Mental Status: He is alert and oriented to person, place, and time.  Psychiatric:        Mood and Affect: Mood normal.        Behavior: Behavior normal.      No  results found for any visits on 01/18/22.    The ASCVD Risk score (Arnett DK, et al., 2019) failed to calculate for the following reasons:   The patient has a prior MI or stroke diagnosis    Assessment & Plan:   Diverticulitis -     CBC with Differential/Platelet -     Comprehensive metabolic panel  Elevated lipase -     Amylase -     Comprehensive metabolic panel -     Lipase  Slow transit constipation    Return in about 1 week (around 01/25/2022).  Continue Augmentin.  Suggested that he try Colace and/or MiraLAX for constipation.  Information was given on these medications.  Libby Maw, MD

## 2022-02-01 ENCOUNTER — Encounter: Payer: Self-pay | Admitting: Family Medicine

## 2022-02-01 ENCOUNTER — Ambulatory Visit: Payer: BLUE CROSS/BLUE SHIELD | Admitting: Family Medicine

## 2022-02-01 VITALS — BP 118/68 | HR 77 | Temp 98.2°F | Ht 70.0 in | Wt 282.0 lb

## 2022-02-01 DIAGNOSIS — R748 Abnormal levels of other serum enzymes: Secondary | ICD-10-CM

## 2022-02-01 DIAGNOSIS — K5901 Slow transit constipation: Secondary | ICD-10-CM

## 2022-02-01 DIAGNOSIS — K8020 Calculus of gallbladder without cholecystitis without obstruction: Secondary | ICD-10-CM

## 2022-02-01 DIAGNOSIS — R935 Abnormal findings on diagnostic imaging of other abdominal regions, including retroperitoneum: Secondary | ICD-10-CM | POA: Diagnosis not present

## 2022-02-01 DIAGNOSIS — K5792 Diverticulitis of intestine, part unspecified, without perforation or abscess without bleeding: Secondary | ICD-10-CM | POA: Diagnosis not present

## 2022-02-01 LAB — CBC
HCT: 40.4 % (ref 39.0–52.0)
Hemoglobin: 13.6 g/dL (ref 13.0–17.0)
MCHC: 33.7 g/dL (ref 30.0–36.0)
MCV: 81.6 fl (ref 78.0–100.0)
Platelets: 255 10*3/uL (ref 150.0–400.0)
RBC: 4.95 Mil/uL (ref 4.22–5.81)
RDW: 15.6 % — ABNORMAL HIGH (ref 11.5–15.5)
WBC: 7.5 10*3/uL (ref 4.0–10.5)

## 2022-02-01 LAB — COMPREHENSIVE METABOLIC PANEL
ALT: 49 U/L (ref 0–53)
AST: 51 U/L — ABNORMAL HIGH (ref 0–37)
Albumin: 3.9 g/dL (ref 3.5–5.2)
Alkaline Phosphatase: 226 U/L — ABNORMAL HIGH (ref 39–117)
BUN: 14 mg/dL (ref 6–23)
CO2: 28 mEq/L (ref 19–32)
Calcium: 8.8 mg/dL (ref 8.4–10.5)
Chloride: 101 mEq/L (ref 96–112)
Creatinine, Ser: 0.81 mg/dL (ref 0.40–1.50)
GFR: 89.95 mL/min (ref >60.00)
Glucose, Bld: 118 mg/dL — ABNORMAL HIGH (ref 70–99)
Potassium: 4.2 mEq/L (ref 3.5–5.1)
Sodium: 138 mEq/L (ref 135–145)
Total Bilirubin: 1.7 mg/dL — ABNORMAL HIGH (ref 0.2–1.2)
Total Protein: 7.8 g/dL (ref 6.0–8.3)

## 2022-02-01 LAB — LIPASE: Lipase: 99 U/L — ABNORMAL HIGH (ref 11.0–59.0)

## 2022-02-01 NOTE — Progress Notes (Signed)
Established Patient Office Visit   Subjective:  Patient ID: THANE AGE, male    DOB: 1950/08/01  Age: 72 y.o. MRN: [id number]  Chief Complaint  Patient presents with   Follow-up    1 week follow up on diverticulitis no concerns.     HPI Encounter Diagnoses  Name Primary?   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  Completed course of therapy and is doing well.  There is no longer any further abdominal pain.  Constipation has resolved.  He is now stooling normally without blood or pus.  Ongoing history of elevated total bili and alkaline phosphatase.  Lipase had increased when I rechecked it at his last visit.  CT of abdomen per renal protocol discovered cholelithiasis, diverticulitis and a 5 cm length of thickening of the proximal sigmoid colon.   Review of Systems  Constitutional: Negative.   HENT: Negative.    Eyes:  Negative for blurred vision, discharge and redness.  Respiratory: Negative.    Cardiovascular: Negative.   Gastrointestinal:  Negative for abdominal pain, blood in stool, constipation and melena.  Genitourinary: Negative.   Musculoskeletal: Negative.  Negative for myalgias.  Skin:  Negative for rash.  Neurological:  Negative for tingling, loss of consciousness and weakness.  Endo/Heme/Allergies:  Negative for polydipsia.     Current Outpatient Medications:    allopurinol (ZYLOPRIM) 300 MG tablet, Take 300 mg by mouth daily., Disp: , Rfl:    atorvastatin (LIPITOR) 80 MG tablet, Take 80 mg by mouth at bedtime., Disp: , Rfl:    bumetanide (BUMEX) 2 MG tablet, Take 2 mg by mouth daily., Disp: , Rfl:    Calcipotriene-Betameth Diprop 7.741-2.878 % FOAM, 1 application by Topical (Top) route daily, Disp: , Rfl:    ramipril (ALTACE) 5 MG  capsule, Take 5 mg by mouth daily., Disp: , Rfl:    traMADol (ULTRAM) 50 MG tablet, Take 50 mg by mouth every 6 (six) hours as needed., Disp: , Rfl:    warfarin (COUMADIN) 2.5 MG tablet, Take 2.5 mg by mouth daily., Disp: , Rfl:    warfarin (COUMADIN) 5 MG tablet, Take 5 mg by mouth daily., Disp: , Rfl:    carvedilol (COREG) 3.125 MG tablet, Take 3.125 mg by mouth 2 (two) times daily. (Patient not taking: Reported on 02/01/2022), Disp: , Rfl:    Objective:     BP 118/68 (BP Location: Right Arm, Patient Position: Sitting, Cuff Size: Large)   Pulse 77   Temp 98.2 F (36.8 C) (Temporal)   Ht 5\' 10"  (1.778 m)   Wt 282 lb (127.9 kg)   SpO2 93%   BMI 40.46 kg/m    Physical Exam Constitutional:      General: He is not in acute distress.    Appearance: Normal appearance. He is not ill-appearing, toxic-appearing or diaphoretic.  HENT:     Head: Normocephalic and atraumatic.     Right Ear: External ear normal.     Left Ear: External ear normal.     Mouth/Throat:     Mouth: Mucous membranes are moist.     Pharynx: Oropharynx is clear. No oropharyngeal exudate or posterior oropharyngeal erythema.  Eyes:     General: No scleral icterus.       Right  eye: No discharge.        Left eye: No discharge.     Extraocular Movements: Extraocular movements intact.     Conjunctiva/sclera: Conjunctivae normal.     Pupils: Pupils are equal, round, and reactive to light.  Cardiovascular:     Rate and Rhythm: Normal rate and regular rhythm.  Pulmonary:     Effort: Pulmonary effort is normal. No respiratory distress.     Breath sounds: Normal breath sounds.  Abdominal:     General: Bowel sounds are normal.     Tenderness: There is no abdominal tenderness. There is no guarding.  Musculoskeletal:     Cervical back: No rigidity or tenderness.  Skin:    General: Skin is warm and dry.  Neurological:     Mental Status: He is alert and oriented to person, place, and time.  Psychiatric:        Mood and  Affect: Mood normal.        Behavior: Behavior normal.      No results found for any visits on 02/01/22.    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  Continue current medications.  Coumadin is managed by cardiology.  Libby Maw, MD

## 2022-02-14 ENCOUNTER — Encounter: Payer: Self-pay | Admitting: Gastroenterology

## 2022-04-13 ENCOUNTER — Ambulatory Visit: Payer: BLUE CROSS/BLUE SHIELD | Admitting: Gastroenterology

## 2022-04-13 ENCOUNTER — Other Ambulatory Visit (INDEPENDENT_AMBULATORY_CARE_PROVIDER_SITE_OTHER): Payer: BLUE CROSS/BLUE SHIELD

## 2022-04-13 ENCOUNTER — Encounter: Payer: Self-pay | Admitting: Gastroenterology

## 2022-04-13 VITALS — BP 126/82 | HR 85 | Ht 70.0 in | Wt 289.0 lb

## 2022-04-13 DIAGNOSIS — K5792 Diverticulitis of intestine, part unspecified, without perforation or abscess without bleeding: Secondary | ICD-10-CM | POA: Diagnosis not present

## 2022-04-13 DIAGNOSIS — R748 Abnormal levels of other serum enzymes: Secondary | ICD-10-CM

## 2022-04-13 LAB — HEPATIC FUNCTION PANEL
ALT: 31 U/L (ref 0–53)
AST: 33 U/L (ref 0–37)
Albumin: 4 g/dL (ref 3.5–5.2)
Alkaline Phosphatase: 217 U/L — ABNORMAL HIGH (ref 39–117)
Bilirubin, Direct: 0.5 mg/dL — ABNORMAL HIGH (ref 0.0–0.3)
Total Bilirubin: 2.3 mg/dL — ABNORMAL HIGH (ref 0.2–1.2)
Total Protein: 8 g/dL (ref 6.0–8.3)

## 2022-04-13 LAB — IBC + FERRITIN
Ferritin: 221 ng/mL (ref 22.0–322.0)
Iron: 57 ug/dL (ref 42–165)
Saturation Ratios: 17.3 % — ABNORMAL LOW (ref 20.0–50.0)
TIBC: 329 ug/dL (ref 250.0–450.0)
Transferrin: 235 mg/dL (ref 212.0–360.0)

## 2022-04-13 LAB — PROTIME-INR
INR: 3.1 ratio — ABNORMAL HIGH (ref 0.8–1.0)
Prothrombin Time: 31 s — ABNORMAL HIGH (ref 9.6–13.1)

## 2022-04-13 LAB — LIPASE: Lipase: 59 U/L (ref 11.0–59.0)

## 2022-04-13 LAB — GAMMA GT: GGT: 123 U/L — ABNORMAL HIGH (ref 7–51)

## 2022-04-13 NOTE — Patient Instructions (Addendum)
_______________________________________________________  If your blood pressure at your visit was 140/90 or greater, please contact your primary care physician to follow up on this.  _______________________________________________________  If you are age 70 or older, your body mass index should be between 23-30. Your Body mass index is 41.47 kg/m. If this is out of the aforementioned range listed, please consider follow up with your Primary Care Provider.  If you are age 52 or younger, your body mass index should be between 19-25. Your Body mass index is 41.47 kg/m. If this is out of the aformentioned range listed, please consider follow up with your Primary Care Provider.   Your provider has requested that you go to the basement level for lab work before leaving today. Press "B" on the elevator. The lab is located at the first door on the left as you exit the elevator.    You have been scheduled for an abdominal ultrasound at [facility] (1st floor of hospital) on 05/18/22 at 9am. Please arrive 15 minutes prior to your appointment for registration. Make certain not to have anything to eat or drink 6 hours prior to your appointment. Should you need to reschedule your appointment, please contact radiology at [phone]. This test typically takes about 30 minutes to perform.    The [facility] providers would like to encourage you to use [facility] to communicate with providers for non-urgent requests or questions.  Due to long hold times on the telephone, sending your provider a message by [facility] may be a faster and more efficient way to get a response.  Please allow 48 business hours for a response.  Please remember that this is for non-urgent requests.   It was a pleasure to see you today!  Thank you for trusting me with your gastrointestinal care!    Scott E.Tomasa Rand, MD

## 2022-04-13 NOTE — Progress Notes (Unsigned)
HPI : Tommy Brooks is a very pleasant 72 year old male with a history of mitral and aortic valve replacement who is referred to [place] by Dr. Nadene Rubins for further evaluation of elevated lipase and recent diverticulitis.  The patient tells me he developed rather abrupt onset abdominal pain in January of this year.  He went to the ED and a CT showed evidence of uncomplicated diverticulitis.  He was prescribed antibiotics and his pain resolved quickly.  He has not had any recurrence of his abdominal pain since then.  He denies any previous history of diverticulitis or any previous history of similar abdominal pain.  He did not have any significant change in his bowel habits around this time. He has never had a colonoscopy.  He states that he has previously  has been advised against a colonoscopy because of his valvular disease and aggressive need for adequate anticoagulation. He has received most of his heart care in [place], [place] and previously has undergone GI evaluation in the [place], [place] area as well, but has not undergone endoscopic evaluation.  The pt reports being diagnosed with hepatitis C many years ago and being given short term medications.  He recalls being clinically jaundiced over 40 years ago.  He has had elevated liver enzymes including elevated bilirubin recently, as well as elevated lipase levels.  Elevated bilirubin levels date back to 2-3 years ago.   The CT that showed evidence of diverticulitis also showed evidence of cholelithiasis, but not cholecystitis or evidence of pancreatitis.  He denies symptoms of recurrent RUQ/epigastric pain.  He denies any family history of liver cancer or liver problems.  CT renal stone protocol Jan 16, 2022 IMPRESSION: 1. Findings consistent with acute diverticulitis of the proximal sigmoid colon without evidence of focal abscess or extraluminal air. Associated segmental wall thickening of a roughly 5 cm segment of the proximal sigmoid colon. Follow-up  recommended to exclude underlying colonic lesion. 2. Cholelithiasis. 3. Small bilateral fat containing inguinal hernias, left greater than right. 4. Atherosclerosis of the distal abdominal aorta and iliac arteries without evidence of aneurysm   Component Ref Range & Units 2 mo ago (02/01/22) 2 mo ago (01/18/22) 2 mo ago (01/16/22)  Lipase 11.0 - 59.0 U/L 99.0 High  83.0 High  59 High  R   omponent Ref Range & Units 2 mo ago  Amylase 27 - 131 U/L 36         Component Ref Range & Units 2 mo ago (02/01/22) 2 mo ago (01/18/22) 2 mo ago (01/16/22) 2 yr ago (08/26/19)  Sodium 135 - 145 mEq/L 138 139 134 Low  R 137 R  Potassium 3.5 - 5.1 mEq/L 4.2 3.9 3.9 R 3.5 R  Chloride 96 - 112 mEq/L 101 105 100 R 103 R  CO2 19 - 32 mEq/L 28 28 23  R 25 R  Glucose, Bld 70 - 99 mg/dL 102 High  585 High  277 High  CM 115 High  CM  BUN 6 - 23 mg/dL 14 19 16  R 15 R  Creatinine, Ser 0.40 - 1.50 mg/dL 8.24 2.35 3.61 R 4.43 R  Total Bilirubin 0.2 - 1.2 mg/dL 1.7 High  3.2 High  3.1 High  R 2.7 High  R  Alkaline Phosphatase 39 - 117 U/L 226 High  161 High  154 High  R 159 High  R  AST 0 - 37 U/L 51 High  41 High  40 R 34 R  ALT 0 - 53  U/L 49 32 34 R 32 R  Total Protein 6.0 - 8.3 g/dL 7.8 7.4 8.4 High  R 7.3 R  Albumin 3.5 - 5.2 g/dL 3.9 3.6 3.8 R 3.6 R  GFR >60.00 mL/min 89.95 78.69 CM       Past Medical History:  Diagnosis Date   Hypertension    MI (myocardial infarction)      Past Surgical History:  Procedure Laterality Date   CARDIAC SURGERY     Family History  Problem Relation Age of Onset   Colon cancer Neg Hx    Stomach cancer Neg Hx    Esophageal cancer Neg Hx    Social History   Tobacco Use   Smoking status: Never   Smokeless tobacco: Never  Vaping Use   Vaping Use: Never used  Substance Use Topics   Alcohol use: Never   Drug use: Never   Current Outpatient Medications  Medication Sig Dispense Refill   allopurinol (ZYLOPRIM) 300 MG tablet Take 300 mg by  mouth daily.     atorvastatin (LIPITOR) 80 MG tablet Take 80 mg by mouth at bedtime.     bumetanide (BUMEX) 2 MG tablet Take 2 mg by mouth daily.     Calcipotriene-Betameth Diprop 0.005-0.064 % FOAM 1 application by Topical (Top) route daily     carvedilol (COREG) 3.125 MG tablet Take 3.125 mg by mouth 2 (two) times daily.     ramipril (ALTACE) 5 MG capsule Take 5 mg by mouth daily.     traMADol (ULTRAM) 50 MG tablet Take 50 mg by mouth every 6 (six) hours as needed.     warfarin (COUMADIN) 2.5 MG tablet Take 2.5 mg by mouth daily.     warfarin (COUMADIN) 5 MG tablet Take 5 mg by mouth daily.     No current facility-administered medications for this visit.   No Known Allergies   Review of Systems: All systems reviewed and negative except where noted in HPI.    No results found.  Physical Exam: BP 126/82   Pulse 85   Ht 5\' 10"  (1.778 m)   Wt 289 lb (131.1 kg)   BMI 41.47 kg/m  Constitutional: Pleasant,well-developed, caucasian male in no acute distress. HEENT: Normocephalic and atraumatic. Conjunctivae are normal. No scleral icterus. Neck supple.  Cardiovascular: Normal rate, regular rhythm.  Pulmonary/chest: Effort normal and breath sounds normal. No wheezing, rales or rhonchi. Abdominal: Soft, nondistended, nontender. Bowel sounds active throughout. There are no masses palpable. No hepatomegaly. Extremities: no edema Neurological: Alert and oriented to person place and time. Skin: Skin is warm and dry. No rashes noted. Psychiatric: Normal mood and affect. Behavior is normal.  CBC    Component Value Date/Time   WBC 7.5 02/01/2022 1134   RBC 4.95 02/01/2022 1134   HGB 13.6 02/01/2022 1134   HCT 40.4 02/01/2022 1134   PLT 255.0 02/01/2022 1134   MCV 81.6 02/01/2022 1134   MCH 27.2 01/16/2022 1652   MCHC 33.7 02/01/2022 1134   RDW 15.6 (H) 02/01/2022 1134   LYMPHSABS 0.8 01/18/2022 1018   MONOABS 1.2 (H) 01/18/2022 1018   EOSABS 0.3 01/18/2022 1018   BASOSABS 0.1  01/18/2022 1018    CMP     Component Value Date/Time   NA 138 02/01/2022 1134   K 4.2 02/01/2022 1134   CL 101 02/01/2022 1134   CO2 28 02/01/2022 1134   GLUCOSE 118 (H) 02/01/2022 1134   BUN 14 02/01/2022 1134   CREATININE 0.81 02/01/2022 1134  CALCIUM 8.8 02/01/2022 1134   PROT 7.8 02/01/2022 1134   ALBUMIN 3.9 02/01/2022 1134   AST 51 (H) 02/01/2022 1134   ALT 49 02/01/2022 1134   ALKPHOS 226 (H) 02/01/2022 1134   BILITOT 1.7 (H) 02/01/2022 1134   GFRNONAA >60 01/16/2022 1652   GFRAA >60 08/26/2019 1635     ASSESSMENT AND PLAN: 72 year old male with extensive cardiac history to include aortic/mitral valve replacement on chronic coumadin with recent episode of uncomplicated diverticulitis.  He also has persistent elevated liver enzymes and elevated lipase with evidence cholelithiasis, but does not have typical chronic symptoms of cholelithiasis or pancreatitis.  He has a history of hepatitis C but not other known liver disease history to explain elevated bilirubin.  High suspicion for underlying advance liver disease given elevated bilirubin in absence of biliary obstructive symptoms.  Possible that HCV is sole underlying etiology, but will exclude other causes of chronic liver disease (autoimmune, genetic).  Will repeat HCV serologies and get viral load if positive.  Significance of elevated lipase questionable in setting of no abdominal pain unclear, but will repeat lipase.  If persistently elevated, will likely get dedicated imaging of pancreas. I recommended a colonoscopy due to the patients recent diverticulitis, but he was very resistant, as he was concerned about 'bleeding out' from a colonoscopy.  I informed him that the risk of bleeding from a colonoscopy was extremely rare in the absence of a high risk polypectomy.  Although his CT abnormalities were most consistent with uncomplicated diverticulitis, a colonoscopy is recommended to exclude neoplasm. The patient would like to  discuss a colonoscopy further with his coumadin clinic and cardiologist.  The indication for his colonoscopy is not urgent; will await the patient's discussion with his pharmacist and cardiologist.  Elevated liver enzymes; reported history of hepatitis C - RUQUS - HCV serologies, r/o Autoimmune/genetic - Lipase  Diverticulitis - Offered colonoscopy, patient declined; wants to talk with cardiologist/coumadin clinic  Kori Goins E. Tomasa Randunningham, MD [facility]   Doreene BurkeKremer, Talmadge CoventryWilliam Alfred,*

## 2022-04-16 LAB — CERULOPLASMIN: Ceruloplasmin: 39 mg/dL — ABNORMAL HIGH (ref 18–36)

## 2022-04-16 LAB — HEPATITIS B SURFACE ANTIGEN: Hepatitis B Surface Ag: NONREACTIVE

## 2022-04-16 LAB — ALPHA-1-ANTITRYPSIN: A-1 Antitrypsin, Ser: 176 mg/dL (ref 83–199)

## 2022-04-17 LAB — HEPATITIS A ANTIBODY, TOTAL: Hepatitis A AB,Total: REACTIVE — AB

## 2022-04-17 LAB — ANTI-SMOOTH MUSCLE ANTIBODY, IGG: Actin (Smooth Muscle) Antibody (IGG): 20 U — ABNORMAL HIGH (ref <20)

## 2022-04-17 LAB — IGG: IgG (Immunoglobin G), Serum: 1959 mg/dL — ABNORMAL HIGH (ref 600–1540)

## 2022-04-18 LAB — MITOCHONDRIAL ANTIBODIES: Mitochondrial M2 Ab, IgG: <=20 U (ref <=20.0)

## 2022-04-18 LAB — HEPATITIS B SURFACE ANTIBODY,QUALITATIVE: Hep B S Ab: NONREACTIVE

## 2022-04-18 LAB — HEPATITIS C ANTIBODY: Hepatitis C Ab: NONREACTIVE

## 2022-04-23 ENCOUNTER — Ambulatory Visit (HOSPITAL_COMMUNITY): Payer: BLUE CROSS/BLUE SHIELD

## 2022-04-26 ENCOUNTER — Encounter: Payer: Self-pay | Admitting: Family Medicine

## 2022-04-26 ENCOUNTER — Ambulatory Visit: Payer: BLUE CROSS/BLUE SHIELD | Admitting: Family Medicine

## 2022-04-26 VITALS — BP 130/78 | HR 82 | Temp 97.8°F | Ht 70.0 in | Wt 275.0 lb

## 2022-04-26 DIAGNOSIS — I252 Old myocardial infarction: Secondary | ICD-10-CM | POA: Diagnosis not present

## 2022-04-26 DIAGNOSIS — Z952 Presence of prosthetic heart valve: Secondary | ICD-10-CM

## 2022-04-26 DIAGNOSIS — L409 Psoriasis, unspecified: Secondary | ICD-10-CM | POA: Diagnosis not present

## 2022-04-26 DIAGNOSIS — Z8739 Personal history of other diseases of the musculoskeletal system and connective tissue: Secondary | ICD-10-CM | POA: Insufficient documentation

## 2022-04-26 DIAGNOSIS — Z125 Encounter for screening for malignant neoplasm of prostate: Secondary | ICD-10-CM

## 2022-04-26 DIAGNOSIS — M1712 Unilateral primary osteoarthritis, left knee: Secondary | ICD-10-CM | POA: Insufficient documentation

## 2022-04-26 MED ORDER — CALCIPOTRIENE-BETAMETH DIPROP 0.005-0.064 % EX FOAM
CUTANEOUS | 1 refills | Status: AC
Start: 2022-04-26 — End: ?

## 2022-04-26 MED ORDER — TRAMADOL HCL 50 MG PO TABS
50.0000 mg | ORAL_TABLET | Freq: Two times a day (BID) | ORAL | 1 refills | Status: AC | PRN
Start: 2022-04-26 — End: ?

## 2022-04-26 NOTE — Progress Notes (Addendum)
Established Patient Office Visit   Subjective:  Patient ID: Tommy Brooks, male    DOB: [birth year]  Age: 72 y.o. MRN: [id number]  Chief Complaint  Patient presents with   Medical Management of Chronic Issues    Pt here for 3 month follow up, pt notes saw gastro notes still hasn't received results on blood work from them yet    Referral    Notes skin issues and is requests a referral to dermatology     HPI Encounter Diagnoses  Name Primary?   Psoriasis Yes   History of MI (myocardial infarction)    History of aortic valve replacement    Tricompartment osteoarthritis of left knee    Screening for prostate cancer    History of gout    For follow-up of above.  Recently traveled back to [place] for follow-up of his history of MI and aortic valve replacement.  Office was closed secondary to the [employer].  Under workup by GI for elevated alkaline phosphatase, lipase.  Workup is ongoing.  Patient initial lab work was reviewed.  History of psoriasis that is no longer responding well to the combination cream with Dovonex and Diprolene.  Increase stress at home.  Block status post recent total knee replacement and is not doing well.  History of end-stage arthritis in left knee and has not been a surgical candidate.  Ultram helps.   Review of Systems  Constitutional: Negative.   HENT: Negative.    Eyes:  Negative for blurred vision, discharge and redness.  Respiratory: Negative.    Cardiovascular: Negative.   Gastrointestinal:  Negative for abdominal pain.  Genitourinary: Negative.   Musculoskeletal:  Positive for joint pain. Negative for myalgias.  Skin:  Negative for rash.  Neurological:  Negative for tingling, loss of consciousness and weakness.  Endo/Heme/Allergies:  Negative for polydipsia.     Current Outpatient Medications:    allopurinol (ZYLOPRIM) 300 MG tablet, Take 300 mg by mouth daily., Disp: , Rfl:    atorvastatin (LIPITOR) 80 MG tablet, Take 80 mg by  mouth at bedtime., Disp: , Rfl:    bumetanide (BUMEX) 2 MG tablet, Take 2 mg by mouth daily., Disp: , Rfl:    carvedilol (COREG) 3.125 MG tablet, Take 3.125 mg by mouth 2 (two) times daily., Disp: , Rfl:    ramipril (ALTACE) 5 MG capsule, Take 5 mg by mouth daily., Disp: , Rfl:    warfarin (COUMADIN) 2.5 MG tablet, Take 2.5 mg by mouth daily., Disp: , Rfl:    warfarin (COUMADIN) 5 MG tablet, Take 5 mg by mouth daily., Disp: , Rfl:    Calcipotriene-Betameth Diprop 0.005-0.064 % FOAM, 1 application by Topical (Top) route daily, Disp: 60 g, Rfl: 1   traMADol (ULTRAM) 50 MG tablet, Take 1 tablet (50 mg total) by mouth every 12 (twelve) hours as needed., Disp: 60 tablet, Rfl: 1   Objective:     BP 130/78   Pulse 82   Temp 97.8 F (36.6 C) (Temporal)   Ht  (1.778 m)   Wt 275 lb (124.7 kg)   SpO2 94%   BMI 39.46 kg/m    Physical Exam Constitutional:      General: He is not in acute distress.    Appearance: Normal appearance. He is not ill-appearing, toxic-appearing or diaphoretic.  HENT:     Head: Normocephalic and atraumatic.     Right Ear: External ear normal.     Left Ear: External ear normal.  Mouth/Throat:     Mouth: Mucous membranes are moist.     Pharynx: Oropharynx is clear. No oropharyngeal exudate or posterior oropharyngeal erythema.  Eyes:     General: No scleral icterus.       Right eye: No discharge.        Left eye: No discharge.     Extraocular Movements: Extraocular movements intact.     Conjunctiva/sclera: Conjunctivae normal.     Pupils: Pupils are equal, round, and reactive to light.  Cardiovascular:     Rate and Rhythm: Normal rate and regular rhythm.  Pulmonary:     Effort: Pulmonary effort is normal. No respiratory distress.     Breath sounds: Normal breath sounds.  Musculoskeletal:     Cervical back: No rigidity or tenderness.  Skin:    General: Skin is warm and dry.  Neurological:     Mental Status: He is alert and oriented to person,  place, and time.  Psychiatric:        Mood and Affect: Mood normal.        Behavior: Behavior normal.      No results found for any visits on 04/26/22.    The ASCVD Risk score (Arnett DK, et al., 2019) failed to calculate for the following reasons:   The patient has a prior MI or stroke diagnosis    Assessment & Plan:   Psoriasis -     Calcipotriene-Betameth Diprop; 1 application by Topical (Top) route daily  Dispense: 60 g; Refill: 1 -     Ambulatory referral to Dermatology  History of MI (myocardial infarction) -     Ambulatory referral to Cardiology -     Lipid panel; Future  History of aortic valve replacement -     Ambulatory referral to Cardiology  Tricompartment osteoarthritis of left knee -     traMADol HCl; Take 1 tablet (50 mg total) by mouth every 12 (twelve) hours as needed.  Dispense: 60 tablet; Refill: 1  Screening for prostate cancer -     PSA; Future -     Urinalysis, Routine w reflex microscopic; Future  History of gout -     Uric acid    Return in about 3 months (around 07/26/2022), or if symptoms worsen or fail to improve.  Return fasting for blood work.  Mliss Sax, MD  4/25 addendum: Cardiology called patient 3 times to schedule appointment and he did not respond.

## 2022-04-27 ENCOUNTER — Other Ambulatory Visit (INDEPENDENT_AMBULATORY_CARE_PROVIDER_SITE_OTHER): Payer: BLUE CROSS/BLUE SHIELD

## 2022-04-27 DIAGNOSIS — I252 Old myocardial infarction: Secondary | ICD-10-CM | POA: Diagnosis not present

## 2022-04-27 DIAGNOSIS — Z125 Encounter for screening for malignant neoplasm of prostate: Secondary | ICD-10-CM | POA: Diagnosis not present

## 2022-04-27 LAB — LIPID PANEL
Cholesterol: 84 mg/dL (ref 0–200)
HDL: 26.2 mg/dL — ABNORMAL LOW (ref >39.00)
LDL Cholesterol: 36 mg/dL (ref 0–99)
NonHDL: 58.01
Total CHOL/HDL Ratio: 3
Triglycerides: 112 mg/dL (ref 0.0–149.0)
VLDL: 22.4 mg/dL (ref 0.0–40.0)

## 2022-04-27 LAB — URINALYSIS, ROUTINE W REFLEX MICROSCOPIC
Bilirubin Urine: NEGATIVE
Hgb urine dipstick: NEGATIVE
Ketones, ur: NEGATIVE
Leukocytes,Ua: NEGATIVE
Nitrite: NEGATIVE
RBC / HPF: NONE SEEN (ref 0–?)
Specific Gravity, Urine: 1.02 (ref 1.000–1.030)
Total Protein, Urine: NEGATIVE
Urine Glucose: NEGATIVE
Urobilinogen, UA: 1 (ref 0.0–1.0)
pH: 6 (ref 5.0–8.0)

## 2022-04-27 LAB — PSA: PSA: 9.33 ng/mL — ABNORMAL HIGH (ref 0.10–4.00)

## 2022-04-30 ENCOUNTER — Telehealth: Payer: Self-pay | Admitting: Family Medicine

## 2022-04-30 NOTE — Telephone Encounter (Signed)
Pt is having trouble getting his prescriptions.  traMADol (ULTRAM) 50 MG tablet [[id number]] needs a PA.   [id number]  Does not have this script  [id number]  , they are wanting a generic script for this one.   [id number] - [place], [place] - [address] AT [facility] OF [address], [place] [address] Phone: [phone]  Fax: [phone] DEA #: [id number]

## 2022-04-30 NOTE — Progress Notes (Signed)
I reviewed the patient's labs in detail with him over the phone today.  Of note, his anti-smooth muscle antibody titer was slightly positive and his total IgG level was elevated.  Under normal circumstances, I would recommend a liver biopsy to assess for evidence of autoimmune hepatitis.  However, the patient was extremely concerned about bleeding from a colonoscopy and the bleeding risk from a liver biopsy would be much higher given his anticoagulation needs.  Therefore, I recommended we repeat these labs in 4 to 6 months.  If his smooth muscle antibody titer is increasing, then it may be reasonable to treat for autoimmune hepatitis. He has an ultrasound that is scheduled for May 10. His INR was 3, but this is secondary to his Coumadin and does not reflect his liver function.  His GGT was elevated, likely indicating that his alkaline phosphatase elevation is biliary in etiology.  However, his fractionated bilirubin was more consistent with an indirect hyperbilirubinemia.  It is possible he has an underlying liver disease as well as Gilbert syndrome.  Will contact patient once his ultrasound is complete with further recommendations.  Bonita Quin, Please place an order for anti-smooth muscle antibody, IgG titer, hepatic panel to be drawn in 4 months

## 2022-05-01 ENCOUNTER — Other Ambulatory Visit: Payer: Self-pay

## 2022-05-01 DIAGNOSIS — R748 Abnormal levels of other serum enzymes: Secondary | ICD-10-CM

## 2022-05-02 NOTE — Telephone Encounter (Signed)
Rx approved patient aware and will pick up today.

## 2022-05-02 NOTE — Telephone Encounter (Signed)
Have started PA on Rx called patient to inform no answer unable to LM will call back.

## 2022-05-03 ENCOUNTER — Ambulatory Visit: Payer: BLUE CROSS/BLUE SHIELD | Admitting: Family Medicine

## 2022-05-04 ENCOUNTER — Ambulatory Visit: Payer: BLUE CROSS/BLUE SHIELD | Admitting: Family Medicine

## 2022-05-07 ENCOUNTER — Encounter: Payer: Self-pay | Admitting: Family Medicine

## 2022-05-07 ENCOUNTER — Ambulatory Visit: Payer: BLUE CROSS/BLUE SHIELD | Admitting: Family Medicine

## 2022-05-07 VITALS — BP 118/72 | HR 72 | Temp 98.6°F | Ht 70.0 in | Wt 278.0 lb

## 2022-05-07 DIAGNOSIS — R972 Elevated prostate specific antigen [PSA]: Secondary | ICD-10-CM | POA: Diagnosis not present

## 2022-05-07 NOTE — Progress Notes (Signed)
Established Patient Office Visit   Subjective:  Patient ID: Tommy Brooks, male    DOB: 09-05-1950  Age: 72 y.o. MRN: [id number]  Chief Complaint  Patient presents with   Advice Only    Discuss elevated PSA levels.     HPI Encounter Diagnoses  Name Primary?   Elevated PSA Yes   For face-to-face discussion of elevated PSA to 9.33.  PSA was elevated 5 years ago when last measured.  Biopsy was recommended but he was not a candidate because he is unable to hold his Coumadin secondary to chronic atrial fibs and status post aortic and mitral valve replacements.  He has no symptoms referable to the urinary tract.  Urine flow is excellent and there is no nocturia.  His wife is developed a DVT status post recent knee replacement surgery.  He is considering moving back to [place].   Review of Systems  Constitutional: Negative.   HENT: Negative.    Eyes:  Negative for blurred vision, discharge and redness.  Respiratory: Negative.    Cardiovascular: Negative.   Gastrointestinal:  Negative for abdominal pain.  Genitourinary: Negative.   Musculoskeletal: Negative.  Negative for myalgias.  Skin:  Negative for rash.  Neurological:  Negative for tingling, loss of consciousness and weakness.  Endo/Heme/Allergies:  Negative for polydipsia.     Current Outpatient Medications:    allopurinol (ZYLOPRIM) 300 MG tablet, Take 300 mg by mouth daily., Disp: , Rfl:    atorvastatin (LIPITOR) 80 MG tablet, Take 80 mg by mouth at bedtime., Disp: , Rfl:    bumetanide (BUMEX) 2 MG tablet, Take 2 mg by mouth daily., Disp: , Rfl:    carvedilol (COREG) 3.125 MG tablet, Take 3.125 mg by mouth 2 (two) times daily., Disp: , Rfl:    ramipril (ALTACE) 5 MG capsule, Take 5 mg by mouth daily., Disp: , Rfl:    traMADol (ULTRAM) 50 MG tablet, Take 1 tablet (50 mg total) by mouth every 12 (twelve) hours as needed., Disp: 60 tablet, Rfl: 1   warfarin (COUMADIN) 2.5 MG tablet, Take 2.5 mg by mouth daily., Disp: , Rfl:     warfarin (COUMADIN) 5 MG tablet, Take 5 mg by mouth daily., Disp: , Rfl:    Calcipotriene-Betameth Diprop 0.005-0.064 % FOAM, 1 application by Topical (Top) route daily (Patient not taking: Reported on 05/07/2022), Disp: 60 g, Rfl: 1   Objective:     BP 118/72 (BP Location: Left Arm, Patient Position: Sitting, Cuff Size: Large)   Pulse 72   Temp 98.6 F (37 C) (Temporal)   Ht 5\' 10"  (1.778 m)   Wt 278 lb (126.1 kg)   SpO2 96%   BMI 39.89 kg/m    Physical Exam Constitutional:      General: He is not in acute distress.    Appearance: Normal appearance. He is not ill-appearing, toxic-appearing or diaphoretic.  HENT:     Head: Normocephalic and atraumatic.     Right Ear: External ear normal.     Left Ear: External ear normal.  Eyes:     General: No scleral icterus.       Right eye: No discharge.        Left eye: No discharge.     Extraocular Movements: Extraocular movements intact.     Conjunctiva/sclera: Conjunctivae normal.  Pulmonary:     Effort: Pulmonary effort is normal. No respiratory distress.  Skin:    General: Skin is warm and dry.  Neurological:     Mental  Status: He is alert and oriented to person, place, and time.  Psychiatric:        Mood and Affect: Mood normal.        Behavior: Behavior normal.      No results found for any visits on 05/07/22.    The ASCVD Risk score (Arnett DK, et al., 2019) failed to calculate for the following reasons:   The patient has a prior MI or stroke diagnosis    Assessment & Plan:   Elevated PSA -     Ambulatory referral to Urology    Return in about 3 months (around 08/06/2022), or if symptoms worsen or fail to improve.   Offered MRI of prostate, he would prefer to go see the urologist. Mliss Sax, MD

## 2022-05-18 ENCOUNTER — Ambulatory Visit (HOSPITAL_COMMUNITY): Admission: RE | Admit: 2022-05-18 | Payer: BLUE CROSS/BLUE SHIELD | Source: Ambulatory Visit

## 2022-05-22 ENCOUNTER — Ambulatory Visit: Payer: BLUE CROSS/BLUE SHIELD | Admitting: Urology

## 2022-05-22 ENCOUNTER — Encounter: Payer: Self-pay | Admitting: Urology

## 2022-05-22 VITALS — BP 137/86 | HR 87 | Ht 70.0 in | Wt 270.0 lb

## 2022-05-22 DIAGNOSIS — R972 Elevated prostate specific antigen [PSA]: Secondary | ICD-10-CM

## 2022-05-22 LAB — URINALYSIS, ROUTINE W REFLEX MICROSCOPIC
Bilirubin, UA: NEGATIVE
Glucose, UA: NEGATIVE
Ketones, UA: NEGATIVE
Leukocytes,UA: NEGATIVE
Nitrite, UA: NEGATIVE
Protein,UA: NEGATIVE
RBC, UA: NEGATIVE
Specific Gravity, UA: 1.015 (ref 1.005–1.030)
Urobilinogen, Ur: 0.2 mg/dL (ref 0.2–1.0)
pH, UA: 5.5 (ref 5.0–7.5)

## 2022-05-22 NOTE — Progress Notes (Signed)
   Assessment: 1. Elevated PSA      Plan: Today I had a long discussion with the patient regarding his elevated PSA along with the issues and controversies regarding prostate cancer early detection.  Certainly he is at high risk given his need for ongoing anticoagulation.  He would need to be done under a Lovenox bridge.  As noted, he is returning to [place] in 2 days.  I gave him my opinion today that I would see a local urologist in [place] as well as his established cardiologist.  I also told him that I would recommend a prostate MRI prior to biopsy. Patient will return as needed  Chief Complaint: elevated psa  History of Present Illness:  Tommy Brooks is a 72 y.o. male who is seen in consultation from Mliss Sax, MD for evaluation of elevated PSA. Patient has significant high risk medical comorbidities including coronary artery disease, hypertension, A-fib, status post aortic/mitral valve replacement on Coumadin. PSA has not been checked in approximately 5 years.  At that time it was apparently elevated and the patient was evaluated by urology and biopsy was discussed but due to his comorbidities and need for anticoagulation biopsy was not performed.  Patient states that at that time his cardiologist did not want him to go through with the procedure.  The patient's cardiologist is still in [place].  Patient reports that he is planning on returning to [place] in 2 days.  He has been visiting here helping a friend with a knee replacement.  PSA 04/2022 = 9.33   Past Medical History:  Past Medical History:  Diagnosis Date   Hypertension    MI (myocardial infarction) (HCC)     Past Surgical History:  Past Surgical History:  Procedure Laterality Date   CARDIAC SURGERY      Allergies:  No Known Allergies  Family History:  Family History  Problem Relation Age of Onset   Colon cancer Neg Hx    Stomach cancer Neg Hx    Esophageal cancer Neg Hx     Social  History:  Social History   Tobacco Use   Smoking status: Never   Smokeless tobacco: Never  Vaping Use   Vaping Use: Never used  Substance Use Topics   Alcohol use: Never   Drug use: Never    Review of symptoms:  Constitutional:  Negative for unexplained weight loss, night sweats, fever, chills ENT:  Negative for nose bleeds, sinus pain, painful swallowing CV:  Negative for chest pain, shortness of breath, exercise intolerance, palpitations, loss of consciousness Resp:  Negative for cough, wheezing, shortness of breath GI:  Negative for nausea, vomiting, diarrhea, bloody stools GU:  Positives noted in HPI; otherwise negative for gross hematuria, dysuria, urinary incontinence Neuro:  Negative for seizures, poor balance, limb weakness, slurred speech Psych:  Negative for lack of energy, depression, anxiety Endocrine:  Negative for polydipsia, polyuria, symptoms of hypoglycemia (dizziness, hunger, sweating) Hematologic:  Negative for anemia, purpura, petechia, prolonged or excessive bleeding, use of anticoagulants  Allergic:  Negative for difficulty breathing or choking as a result of exposure to anything; no shellfish allergy; no allergic response (rash/itch) to materials, foods  Physical exam: BP 137/86   Pulse 87   Ht 5\' 10"  (1.778 m)   Wt 270 lb (122.5 kg)   BMI 38.74 kg/m  GENERAL APPEARANCE:  Well appearing, well developed, well nourished, NAD  Patient declined dre  Results: UA neg

## 2022-07-26 ENCOUNTER — Ambulatory Visit: Payer: BLUE CROSS/BLUE SHIELD | Admitting: Family Medicine

## 2022-08-07 ENCOUNTER — Ambulatory Visit: Payer: BLUE CROSS/BLUE SHIELD | Admitting: Family Medicine

## 2022-08-31 ENCOUNTER — Other Ambulatory Visit: Payer: Self-pay

## 2022-08-31 DIAGNOSIS — R748 Abnormal levels of other serum enzymes: Secondary | ICD-10-CM

## 2023-03-11 ENCOUNTER — Encounter (HOSPITAL_BASED_OUTPATIENT_CLINIC_OR_DEPARTMENT_OTHER): Payer: Self-pay | Admitting: Emergency Medicine

## 2023-03-11 ENCOUNTER — Other Ambulatory Visit: Payer: Self-pay

## 2023-03-11 ENCOUNTER — Emergency Department (HOSPITAL_BASED_OUTPATIENT_CLINIC_OR_DEPARTMENT_OTHER): Admission: EM | Admit: 2023-03-11 | Discharge: 2023-03-11 | Discharge status: Against Medical Advice

## 2023-03-11 DIAGNOSIS — R1030 Lower abdominal pain, unspecified: Secondary | ICD-10-CM | POA: Insufficient documentation

## 2023-03-11 DIAGNOSIS — R112 Nausea with vomiting, unspecified: Secondary | ICD-10-CM | POA: Insufficient documentation

## 2023-03-11 DIAGNOSIS — Z5321 Procedure and treatment not carried out due to patient leaving prior to being seen by health care provider: Secondary | ICD-10-CM | POA: Diagnosis not present

## 2023-03-11 DIAGNOSIS — R197 Diarrhea, unspecified: Secondary | ICD-10-CM | POA: Diagnosis not present

## 2023-03-11 HISTORY — DX: Diverticulitis of intestine, part unspecified, without perforation or abscess without bleeding: K57.92

## 2023-03-11 LAB — COMPREHENSIVE METABOLIC PANEL
ALT: 23 U/L (ref 0–44)
AST: 33 U/L (ref 15–41)
Albumin: 3.6 g/dL (ref 3.5–5.0)
Alkaline Phosphatase: 127 U/L — ABNORMAL HIGH (ref 38–126)
Anion gap: 15 (ref 5–15)
BUN: 16 mg/dL (ref 8–23)
CO2: 21 mmol/L — ABNORMAL LOW (ref 22–32)
Calcium: 8.9 mg/dL (ref 8.9–10.3)
Chloride: 103 mmol/L (ref 98–111)
Creatinine, Ser: 0.88 mg/dL (ref 0.61–1.24)
GFR, Estimated: >60 mL/min (ref >60)
Glucose, Bld: 110 mg/dL — ABNORMAL HIGH (ref 70–99)
Potassium: 3.2 mmol/L — ABNORMAL LOW (ref 3.5–5.1)
Sodium: 139 mmol/L (ref 135–145)
Total Bilirubin: 1.6 mg/dL — ABNORMAL HIGH (ref 0.0–1.2)
Total Protein: 7.6 g/dL (ref 6.5–8.1)

## 2023-03-11 LAB — LIPASE, BLOOD: Lipase: 45 U/L (ref 11–51)

## 2023-03-11 LAB — CBC
HCT: 41.5 % (ref 39.0–52.0)
Hemoglobin: 13.4 g/dL (ref 13.0–17.0)
MCH: 26 pg (ref 26.0–34.0)
MCHC: 32.3 g/dL (ref 30.0–36.0)
MCV: 80.6 fL (ref 80.0–100.0)
Platelets: 243 10*3/uL (ref 150–400)
RBC: 5.15 MIL/uL (ref 4.22–5.81)
RDW: 15.6 % — ABNORMAL HIGH (ref 11.5–15.5)
WBC: 12 10*3/uL — ABNORMAL HIGH (ref 4.0–10.5)
nRBC: 0 % (ref 0.0–0.2)

## 2023-03-11 NOTE — ED Triage Notes (Signed)
 Lower abd pain dx with diverticulitis last  year, has had n/v/d

## 2023-03-11 NOTE — ED Notes (Signed)
 Patient noted to leave department by registration

## 2023-03-12 ENCOUNTER — Encounter (HOSPITAL_BASED_OUTPATIENT_CLINIC_OR_DEPARTMENT_OTHER): Payer: Self-pay | Admitting: Emergency Medicine

## 2023-03-12 ENCOUNTER — Other Ambulatory Visit: Payer: Self-pay

## 2023-03-12 ENCOUNTER — Emergency Department (HOSPITAL_BASED_OUTPATIENT_CLINIC_OR_DEPARTMENT_OTHER)
Admission: EM | Admit: 2023-03-12 | Discharge: 2023-03-12 | Disposition: A | Discharge status: Home / Self Care | Attending: Emergency Medicine | Admitting: Emergency Medicine

## 2023-03-12 ENCOUNTER — Emergency Department (HOSPITAL_BASED_OUTPATIENT_CLINIC_OR_DEPARTMENT_OTHER)

## 2023-03-12 DIAGNOSIS — R197 Diarrhea, unspecified: Secondary | ICD-10-CM

## 2023-03-12 DIAGNOSIS — I10 Essential (primary) hypertension: Secondary | ICD-10-CM | POA: Diagnosis not present

## 2023-03-12 DIAGNOSIS — K5732 Diverticulitis of large intestine without perforation or abscess without bleeding: Secondary | ICD-10-CM | POA: Diagnosis not present

## 2023-03-12 DIAGNOSIS — Z7901 Long term (current) use of anticoagulants: Secondary | ICD-10-CM | POA: Diagnosis not present

## 2023-03-12 LAB — COMPREHENSIVE METABOLIC PANEL
ALT: 17 U/L (ref 0–44)
AST: 25 U/L (ref 15–41)
Albumin: 4 g/dL (ref 3.5–5.0)
Alkaline Phosphatase: 131 U/L — ABNORMAL HIGH (ref 38–126)
Anion gap: 8 (ref 5–15)
BUN: 12 mg/dL (ref 8–23)
CO2: 29 mmol/L (ref 22–32)
Calcium: 9.4 mg/dL (ref 8.9–10.3)
Chloride: 100 mmol/L (ref 98–111)
Creatinine, Ser: 0.78 mg/dL (ref 0.61–1.24)
GFR, Estimated: >60 mL/min (ref >60)
Glucose, Bld: 112 mg/dL — ABNORMAL HIGH (ref 70–99)
Potassium: 3.5 mmol/L (ref 3.5–5.1)
Sodium: 137 mmol/L (ref 135–145)
Total Bilirubin: 1.8 mg/dL — ABNORMAL HIGH (ref 0.0–1.2)
Total Protein: 7.7 g/dL (ref 6.5–8.1)

## 2023-03-12 LAB — CBC
HCT: 42.8 % (ref 39.0–52.0)
Hemoglobin: 14 g/dL (ref 13.0–17.0)
MCH: 26.3 pg (ref 26.0–34.0)
MCHC: 32.7 g/dL (ref 30.0–36.0)
MCV: 80.5 fL (ref 80.0–100.0)
Platelets: 254 10*3/uL (ref 150–400)
RBC: 5.32 MIL/uL (ref 4.22–5.81)
RDW: 15.6 % — ABNORMAL HIGH (ref 11.5–15.5)
WBC: 9.9 10*3/uL (ref 4.0–10.5)
nRBC: 0 % (ref 0.0–0.2)

## 2023-03-12 LAB — LIPASE, BLOOD: Lipase: 51 U/L (ref 11–51)

## 2023-03-12 MED ORDER — SODIUM CHLORIDE 0.9 % IV BOLUS
1000.0000 mL | Freq: Once | INTRAVENOUS | Status: AC
  Administered 2023-03-12: 1000 mL via INTRAVENOUS

## 2023-03-12 MED ORDER — IOHEXOL 300 MG/ML  SOLN
100.0000 mL | Freq: Once | INTRAMUSCULAR | Status: AC | PRN
  Administered 2023-03-12: 100 mL via INTRAVENOUS

## 2023-03-12 MED ORDER — AMOXICILLIN-POT CLAVULANATE 875-125 MG PO TABS: 1.0000 | ORAL_TABLET | Freq: Two times a day (BID) | ORAL | 0 refills | Status: AC

## 2023-03-12 NOTE — ED Notes (Signed)
 Blood work obtained by Engineer, materials.Marland KitchenMarland Kitchen

## 2023-03-12 NOTE — ED Provider Notes (Signed)
 [place] EMERGENCY DEPARTMENT AT [facility] Provider Note   CSN: [id number] Arrival date & time: 03/12/23  7829     History  Chief Complaint  Patient presents with   Diarrhea    Tommy Brooks is a 73 y.o. male.  With a history of hypertension, diverticulitis presenting to the ED for evaluation of diarrhea.  States he developed diarrhea 7 days ago.  He was initially having a bowel movement every 15 minutes.  States he has been taking a lot of Imodium and the diarrhea has slowed down.  He has had 3 episodes of diarrhea today.  He denies melena or hematochezia.  States that it is yellow in color and mucus consistency.  He reports associated lower left side cramping abdominal pain.  He did have some nausea but this has resolved.  He denies any vomiting.  No fevers or chills.  He denies recent travel, hospitalizations, antibiotic use, medication changes.  He denies known sick contacts.  He is unaware of any potential foodborne illness.  States he is here visiting from [place].  Presents today to try to figure out what is going on so he can update his primary care provider.   Diarrhea      Home Medications Prior to Admission medications   Medication Sig Start Date End Date Taking? Authorizing Provider  amoxicillin-clavulanate (AUGMENTIN) 875-125 MG tablet Take 1 tablet by mouth every 12 (twelve) hours. 03/12/23  Yes Emmalina Espericueta, Edsel Petrin, PA-C  allopurinol (ZYLOPRIM) 300 MG tablet Take 300 mg by mouth daily. 08/04/19   [provider]  atorvastatin (LIPITOR) 80 MG tablet Take 80 mg by mouth at bedtime. 08/23/19   [provider]  bumetanide (BUMEX) 2 MG tablet Take 2 mg by mouth daily. 08/13/19   [provider]  Calcipotriene-Betameth Diprop 0.005-0.064 % FOAM 1 application by Topical (Top) route daily 04/26/22   Mliss Sax, MD  carvedilol (COREG) 3.125 MG tablet Take 3.125 mg by mouth 2 (two) times daily. 05/20/19   [provider]   ramipril (ALTACE) 5 MG capsule Take 5 mg by mouth daily. 08/20/19   [provider]  traMADol (ULTRAM) 50 MG tablet Take 1 tablet (50 mg total) by mouth every 12 (twelve) hours as needed. 04/26/22   Mliss Sax, MD  warfarin (COUMADIN) 2.5 MG tablet Take 2.5 mg by mouth daily.    [provider]  warfarin (COUMADIN) 5 MG tablet Take 5 mg by mouth daily.    [provider]      Allergies    Patient has no known allergies.    Review of Systems   Review of Systems  Gastrointestinal:  Positive for diarrhea.  All other systems reviewed and are negative.   Physical Exam Updated Vital Signs BP 113/76   Pulse 90   Temp 98.6 F (37 C)   Resp 18   SpO2 99%  Physical Exam Vitals and nursing note reviewed.  Constitutional:      General: He is not in acute distress.    Appearance: Normal appearance. He is normal weight. He is not ill-appearing.     Comments: Resting comfortably in bed  HENT:     Head: Normocephalic and atraumatic.  Cardiovascular:     Rate and Rhythm: Normal rate and regular rhythm.  Pulmonary:     Effort: Pulmonary effort is normal. No respiratory distress.  Abdominal:     General: Abdomen is flat.     Tenderness: There is no abdominal tenderness.  There is no guarding.  Musculoskeletal:        General: Normal range of motion.     Cervical back: Neck supple.  Skin:    General: Skin is warm and dry.  Neurological:     Mental Status: He is alert and oriented to person, place, and time.  Psychiatric:        Mood and Affect: Mood normal.        Behavior: Behavior normal.     ED Results / Procedures / Treatments   Labs (all labs ordered are listed, but only abnormal results are displayed) Labs Reviewed  COMPREHENSIVE METABOLIC PANEL - Abnormal; Notable for the following components:      Result Value   Glucose, Bld 112 (*)    Alkaline Phosphatase 131 (*)    Total Bilirubin 1.8 (*)    All other components within normal  limits  CBC - Abnormal; Notable for the following components:   RDW 15.6 (*)    All other components within normal limits  LIPASE, BLOOD  URINALYSIS, ROUTINE W REFLEX MICROSCOPIC    EKG None  Radiology CT ABDOMEN PELVIS W CONTRAST Result Date: 03/12/2023 CLINICAL DATA:  Left lower quadrant pain and diarrhea for 1 week. EXAM: CT ABDOMEN AND PELVIS WITH CONTRAST TECHNIQUE: Multidetector CT imaging of the abdomen and pelvis was performed using the standard protocol following bolus administration of intravenous contrast. RADIATION DOSE REDUCTION: This exam was performed according to the departmental dose-optimization program which includes automated exposure control, adjustment of the mA and/or kV according to patient size and/or use of iterative reconstruction technique. CONTRAST:  OMNIPAQUE IOHEXOL 300 MG/ML  SOLN COMPARISON:  01/16/2022 FINDINGS: Lower Chest: No acute findings. Hepatobiliary: No suspicious hepatic masses identified. Small gallstones again seen, without signs of cholecystitis or biliary ductal dilatation. Pancreas:  No mass or inflammatory changes. Spleen: Within normal limits in size and appearance. Adrenals/Urinary Tract: No suspicious masses identified. No evidence of ureteral calculi or hydronephrosis. Stomach/Bowel: Mild sigmoid diverticulitis is seen. No evidence of perforation, abscess, or other complication. Vascular/Lymphatic: No pathologically enlarged lymph nodes. No acute vascular findings. Reproductive:  No mass or other significant abnormality. Other:  None. Musculoskeletal:  No suspicious bone lesions identified. IMPRESSION: Mild uncomplicated sigmoid diverticulitis. Cholelithiasis. No radiographic evidence of cholecystitis. Electronically Signed   By: Danae Orleans M.D.   On: 03/12/2023 12:57    Procedures Procedures    Medications Ordered in ED Medications  sodium chloride 0.9 % bolus 1,000 mL (1,000 mLs Intravenous New Bag/Given 03/12/23 1105)  iohexol  (OMNIPAQUE) 300 MG/ML solution 100 mL (100 mLs Intravenous Contrast Given 03/12/23 1112)    ED Course/ Medical Decision Making/ A&P                                 Medical Decision Making Amount and/or Complexity of Data Reviewed Labs: ordered. Radiology: ordered.  Risk Prescription drug management.  This patient presents to the ED for concern of diarrhea, this involves an extensive number of treatment options, and is a complaint that carries with it a high risk of complications and morbidity. The differential diagnosis of diarrhea includes but is not limited to Viral- norovirus/rotavirus; Bacterial-Campylobacter,Shigella, Salmonella, Escherichia coli, E. coli 0157:H7, Yersinia enterocolitica, Vibrio cholerae, Clostridium difficile. Parasitic- Giardia lamblia, Cryptosporidium,Entamoeba histolytica,Cyclospora, Microsporidium. Toxin- Staphylococcus aureus, Bacillus cereus. Noninfectious causes include GI Bleed, Appendicitis, Mesenteric Ischemia, Diverticulitis, Adrenal Crisis, Thyroid Storm, Toxicologic exposures, Antibiotic or drug-associated, inflammatory bowel disease.  My initial workup includes labs, imaging, IV fluids  Additional history obtained from: Nursing notes from this visit.  I ordered, reviewed and interpreted labs which include: CBC, CMP, lipase.  No leukocytosis or anemia.  No electrolyte derangement or kidney dysfunction.  Alk phos elevated to 131, total bilirubin elevated to 1.8.  These values are near baseline  I ordered imaging studies including CT abdomen pelvis I independently visualized and interpreted imaging which showed acute uncomplicated diverticulitis I agree with the radiologist interpretation  Afebrile, hemodynamically stable.  73 year old male presenting to the ED for evaluation of diarrhea.  Symptoms began 7 days ago.  No melena or hematochezia.  No fevers.  No nausea or vomiting.  He has some mild lower abdominal pain as well.  Has a history of  diverticulitis.  He appears well on physical exam.  Only mild lower abdominal tenderness to palpation.  Abdomen is soft.  Lab workup overall reassuring.  CT abdomen pelvis reveals mild diverticulitis.  Patient was rehydrated in the emergency department.  He was encouraged to follow a clear liquid diet.  Prescription for Augmentin sent.  Suspect diarrhea to be viral in nature.  He has not had a bowel movement in the 5 hours that he stayed in the emergency department.  He was given return precautions.  Stable at discharge.  At this time there does not appear to be any evidence of an acute emergency medical condition and the patient appears stable for discharge with appropriate outpatient follow up. Diagnosis was discussed with patient who verbalizes understanding of care plan and is agreeable to discharge. I have discussed return precautions with patient who verbalizes understanding. Patient encouraged to follow-up with their PCP within 1 week. All questions answered.  Patient's case discussed with Dr. Rosalia Hammers who agrees with plan to discharge with follow-up.   Note: Portions of this report may have been transcribed using voice recognition software. Every effort was made to ensure accuracy; however, inadvertent computerized transcription errors may still be present.        Final Clinical Impression(s) / ED Diagnoses Final diagnoses:  Diverticulitis large intestine w/o perforation or abscess w/o bleeding  Diarrhea, unspecified type    Rx / DC Orders ED Discharge Orders          Ordered    amoxicillin-clavulanate (AUGMENTIN) 875-125 MG tablet  Every 12 hours        03/12/23 1306              Michelle Piper, Cordelia Poche 03/12/23 1310    Margarita Grizzle, MD 03/16/23 1504

## 2023-03-12 NOTE — ED Notes (Signed)
 Pt aware of the need for a urine... Unable to currently provide the sample.Marland KitchenMarland Kitchen

## 2023-03-12 NOTE — Discharge Instructions (Signed)
 You have been seen today for your complaint of diarrhea. Your lab work was reassuring. Your imaging showed diverticulitis which is an inflammation of the colon. Your discharge medications include Augmentin. This is an antibiotic. You should take it as prescribed. You should take it for the entire duration of the prescription. This may cause an upset stomach. This is normal. You may take this with food. Home care instructions are as follows:  Drink clear liquids until your symptoms resolve.  Once your symptoms are gone, slowly progressed to a normal diet over the course of 1 to 2 days, beginning with foods like bananas, apples, rice and toast. Follow up with: Your primary care provider Please seek immediate medical care if you develop any of the following symptoms: Your pain gets worse. Your pooping does not go back to normal. Your symptoms do not get better with treatment. Your symptoms get worse all of a sudden. You have a fever. You vomit more than one time. Your poop is bloody, black, or tarry. At this time there does not appear to be the presence of an emergent medical condition, however there is always the potential for conditions to change. Please read and follow the below instructions.  Do not take your medicine if  develop an itchy rash, swelling in your mouth or lips, or difficulty breathing; call 911 and seek immediate emergency medical attention if this occurs.  You may review your lab tests and imaging results in their entirety on your MyChart account.  Please discuss all results of fully with your primary care provider and other specialist at your follow-up visit.  Note: Portions of this text may have been transcribed using voice recognition software. Every effort was made to ensure accuracy; however, inadvertent computerized transcription errors may still be present.

## 2023-03-12 NOTE — ED Triage Notes (Signed)
 Pt caox4, ambulatory, NAD c/o diarrhea x1 wk. Denies n/v. Pt c/o very mild abd pain with the diarrhea. Minimal PO intake over the past 3 days. Denies fever.

## 2023-03-13 ENCOUNTER — Telehealth: Payer: Self-pay

## 2023-03-13 NOTE — Transitions of Care (Post Inpatient/ED Visit) (Signed)
   03/13/2023  Name: VERNER MCCRONE MRN: [id number] DOB: 19-Jan-1950  Today's TOC FU Call Status: Today's TOC FU Call Status:: Unsuccessful Call (1st Attempt) Unsuccessful Call (1st Attempt) Date: 03/13/23  Attempted to reach the patient regarding the most recent Inpatient/ED visit.  Follow Up Plan: Additional outreach attempts will be made to reach the patient to complete the Transitions of Care (Post Inpatient/ED visit) call.   Signature Arvil Persons, BSN, Charity fundraiser

## 2023-03-15 ENCOUNTER — Encounter (HOSPITAL_BASED_OUTPATIENT_CLINIC_OR_DEPARTMENT_OTHER): Payer: Self-pay | Admitting: Emergency Medicine

## 2023-03-15 ENCOUNTER — Other Ambulatory Visit: Payer: Self-pay

## 2023-03-15 ENCOUNTER — Emergency Department (HOSPITAL_BASED_OUTPATIENT_CLINIC_OR_DEPARTMENT_OTHER)
Admission: EM | Admit: 2023-03-15 | Discharge: 2023-03-15 | Disposition: A | Discharge status: Home / Self Care | Attending: Emergency Medicine | Admitting: Emergency Medicine

## 2023-03-15 DIAGNOSIS — K5792 Diverticulitis of intestine, part unspecified, without perforation or abscess without bleeding: Secondary | ICD-10-CM | POA: Insufficient documentation

## 2023-03-15 DIAGNOSIS — Z8673 Personal history of transient ischemic attack (TIA), and cerebral infarction without residual deficits: Secondary | ICD-10-CM | POA: Insufficient documentation

## 2023-03-15 DIAGNOSIS — A09 Infectious gastroenteritis and colitis, unspecified: Secondary | ICD-10-CM | POA: Diagnosis not present

## 2023-03-15 DIAGNOSIS — Z79899 Other long term (current) drug therapy: Secondary | ICD-10-CM | POA: Diagnosis not present

## 2023-03-15 DIAGNOSIS — I10 Essential (primary) hypertension: Secondary | ICD-10-CM | POA: Insufficient documentation

## 2023-03-15 DIAGNOSIS — E876 Hypokalemia: Secondary | ICD-10-CM | POA: Insufficient documentation

## 2023-03-15 DIAGNOSIS — R109 Unspecified abdominal pain: Secondary | ICD-10-CM | POA: Diagnosis present

## 2023-03-15 LAB — URINALYSIS, ROUTINE W REFLEX MICROSCOPIC
Bacteria, UA: NONE SEEN
Bilirubin Urine: NEGATIVE
Glucose, UA: NEGATIVE mg/dL
Hgb urine dipstick: NEGATIVE
Ketones, ur: NEGATIVE mg/dL
Leukocytes,Ua: NEGATIVE
Nitrite: NEGATIVE
Protein, ur: 30 mg/dL — AB
Specific Gravity, Urine: 1.025 (ref 1.005–1.030)
pH: 5.5 (ref 5.0–8.0)

## 2023-03-15 LAB — COMPREHENSIVE METABOLIC PANEL
ALT: 13 U/L (ref 0–44)
AST: 18 U/L (ref 15–41)
Albumin: 3.6 g/dL (ref 3.5–5.0)
Alkaline Phosphatase: 101 U/L (ref 38–126)
Anion gap: 9 (ref 5–15)
BUN: 11 mg/dL (ref 8–23)
CO2: 27 mmol/L (ref 22–32)
Calcium: 8.4 mg/dL — ABNORMAL LOW (ref 8.9–10.3)
Chloride: 99 mmol/L (ref 98–111)
Creatinine, Ser: 0.86 mg/dL (ref 0.61–1.24)
GFR, Estimated: >60 mL/min (ref >60)
Glucose, Bld: 105 mg/dL — ABNORMAL HIGH (ref 70–99)
Potassium: 3.1 mmol/L — ABNORMAL LOW (ref 3.5–5.1)
Sodium: 135 mmol/L (ref 135–145)
Total Bilirubin: 1.8 mg/dL — ABNORMAL HIGH (ref 0.0–1.2)
Total Protein: 6.9 g/dL (ref 6.5–8.1)

## 2023-03-15 LAB — CBC
HCT: 40.3 % (ref 39.0–52.0)
Hemoglobin: 13.2 g/dL (ref 13.0–17.0)
MCH: 26.3 pg (ref 26.0–34.0)
MCHC: 32.8 g/dL (ref 30.0–36.0)
MCV: 80.3 fL (ref 80.0–100.0)
Platelets: 241 10*3/uL (ref 150–400)
RBC: 5.02 MIL/uL (ref 4.22–5.81)
RDW: 15.8 % — ABNORMAL HIGH (ref 11.5–15.5)
WBC: 13.7 10*3/uL — ABNORMAL HIGH (ref 4.0–10.5)
nRBC: 0 % (ref 0.0–0.2)

## 2023-03-15 LAB — LIPASE, BLOOD: Lipase: 26 U/L (ref 11–51)

## 2023-03-15 MED ORDER — OXYCODONE-ACETAMINOPHEN 5-325 MG PO TABS: 1.0000 | ORAL_TABLET | Freq: Four times a day (QID) | ORAL | 0 refills | Status: AC | PRN

## 2023-03-15 MED ORDER — ONDANSETRON HCL 4 MG/2ML IJ SOLN
4.0000 mg | Freq: Once | INTRAMUSCULAR | Status: AC
  Administered 2023-03-15: 4 mg via INTRAVENOUS
  Filled 2023-03-15: qty 2

## 2023-03-15 MED ORDER — SODIUM CHLORIDE 0.9 % IV BOLUS
1000.0000 mL | Freq: Once | INTRAVENOUS | Status: AC
  Administered 2023-03-15: 1000 mL via INTRAVENOUS

## 2023-03-15 MED ORDER — POTASSIUM CHLORIDE CRYS ER 20 MEQ PO TBCR
40.0000 meq | EXTENDED_RELEASE_TABLET | Freq: Once | ORAL | Status: AC
  Administered 2023-03-15: 40 meq via ORAL
  Filled 2023-03-15: qty 2

## 2023-03-15 MED ORDER — ONDANSETRON 4 MG PO TBDP: 4.0000 mg | ORAL_TABLET | Freq: Four times a day (QID) | ORAL | 0 refills | Status: AC | PRN

## 2023-03-15 MED ORDER — MORPHINE SULFATE (PF) 4 MG/ML IV SOLN
4.0000 mg | Freq: Once | INTRAVENOUS | Status: DC
  Filled 2023-03-15: qty 1

## 2023-03-15 NOTE — ED Provider Notes (Signed)
 [place] EMERGENCY DEPARTMENT AT [facility] Provider Note   CSN: [id number] Arrival date & time: 03/15/23  1202     History  Chief Complaint  Patient presents with   Abdominal Pain   The past medical history significant for CVA, MI, hypertension who was recently diagnosed with diverticulitis, he reports persistent diarrhea for the last 2 weeks, seen in the ED 3 days ago and CT scan positive for diverticulitis, reports that he is still having extremely frequent diarrhea despite taking Imodium at home.  He reports that he was on a liquid diet for 2 days but then started eating some rice, has returned to liquid diet because pain ongoing/slightly worse.  Endorses hard to keep food down secondary to nausea, his primary complaint is that he is trying to follow-up with his primary care doctor in [place] but has been unable to secondary to his extremely frequent diarrhea.  Denies any blood in his stool.   Abdominal Pain      Home Medications Prior to Admission medications   Medication Sig Start Date End Date Taking? Authorizing Provider  ondansetron (ZOFRAN-ODT) 4 MG disintegrating tablet Take 1 tablet (4 mg total) by mouth every 6 (six) hours as needed for nausea or vomiting. 03/15/23  Yes Indigo Chaddock H, PA-C  oxyCODONE-acetaminophen (PERCOCET/ROXICET) 5-325 MG tablet Take 1 tablet by mouth every 6 (six) hours as needed for severe pain (pain score 7-10). 03/15/23  Yes Athziri Freundlich H, PA-C  allopurinol (ZYLOPRIM) 300 MG tablet Take 300 mg by mouth daily. 08/04/19   [provider]  amoxicillin-clavulanate (AUGMENTIN) 875-125 MG tablet Take 1 tablet by mouth every 12 (twelve) hours. 03/12/23   Schutt, Edsel Petrin, PA-C  atorvastatin (LIPITOR) 80 MG tablet Take 80 mg by mouth at bedtime. 08/23/19   [provider]  bumetanide (BUMEX) 2 MG tablet Take 2 mg by mouth daily. 08/13/19   [provider]  Calcipotriene-Betameth Diprop 0.005-0.064 % FOAM 1  application by Topical (Top) route daily 04/26/22   Mliss Sax, MD  carvedilol (COREG) 3.125 MG tablet Take 3.125 mg by mouth 2 (two) times daily. 05/20/19   [provider]  ramipril (ALTACE) 5 MG capsule Take 5 mg by mouth daily. 08/20/19   [provider]  traMADol (ULTRAM) 50 MG tablet Take 1 tablet (50 mg total) by mouth every 12 (twelve) hours as needed. 04/26/22   Mliss Sax, MD  warfarin (COUMADIN) 2.5 MG tablet Take 2.5 mg by mouth daily.    [provider]  warfarin (COUMADIN) 5 MG tablet Take 5 mg by mouth daily.    [provider]      Allergies    Patient has no known allergies.    Review of Systems   Review of Systems  Gastrointestinal:  Positive for abdominal pain.  All other systems reviewed and are negative.   Physical Exam Updated Vital Signs BP 99/82   Pulse 94   Temp 97.8 F (36.6 C)   Resp 16   Ht 5\' 9"  (1.753 m)   Wt 117 kg   SpO2 96%   BMI 38.09 kg/m  Physical Exam Vitals and nursing note reviewed.  Constitutional:      General: He is not in acute distress.    Appearance: Normal appearance.  HENT:     Head: Normocephalic and atraumatic.  Eyes:     General:        Right eye: No discharge.        Left eye:  No discharge.  Cardiovascular:     Rate and Rhythm: Normal rate and regular rhythm.     Heart sounds: No murmur heard.    No friction rub. No gallop.     Comments: Mild tachycardia on arrival improved after fluids Pulmonary:     Effort: Pulmonary effort is normal.     Breath sounds: Normal breath sounds.  Abdominal:     General: Bowel sounds are normal.     Palpations: Abdomen is soft.     Comments: Moderate tenderness to palpation in the left lower quadrant, no rebound, rigidity or guarding.  Skin:    General: Skin is warm and dry.     Capillary Refill: Capillary refill takes less than 2 seconds.  Neurological:     Mental Status: He is alert and oriented to person, place, and time.   Psychiatric:        Mood and Affect: Mood normal.        Behavior: Behavior normal.     ED Results / Procedures / Treatments   Labs (all labs ordered are listed, but only abnormal results are displayed) Labs Reviewed  COMPREHENSIVE METABOLIC PANEL - Abnormal; Notable for the following components:      Result Value   Potassium 3.1 (*)    Glucose, Bld 105 (*)    Calcium 8.4 (*)    Total Bilirubin 1.8 (*)    All other components within normal limits  CBC - Abnormal; Notable for the following components:   WBC 13.7 (*)    RDW 15.8 (*)    All other components within normal limits  URINALYSIS, ROUTINE W REFLEX MICROSCOPIC - Abnormal; Notable for the following components:   Protein, ur 30 (*)    All other components within normal limits  GASTROINTESTINAL PANEL BY PCR, STOOL (REPLACES STOOL CULTURE)  LIPASE, BLOOD    EKG None  Radiology No results found.  Procedures Procedures    Medications Ordered in ED Medications  morphine (PF) 4 MG/ML injection 4 mg (0 mg Intravenous Hold 03/15/23 1402)  ondansetron (ZOFRAN) injection 4 mg (4 mg Intravenous Given 03/15/23 1401)  sodium chloride 0.9 % bolus 1,000 mL (1,000 mLs Intravenous New Bag/Given 03/15/23 1400)  potassium chloride SA (KLOR-CON M) CR tablet 40 mEq (40 mEq Oral Given 03/15/23 1346)    ED Course/ Medical Decision Making/ A&P                                 Medical Decision Making Amount and/or Complexity of Data Reviewed Labs: ordered.  Risk Prescription drug management.   This patient is a 73 y.o. male  who presents to the ED for concern of ongoing abdominal pain, nausea, diarrhea with known recent hx of diverticulitis.   Differential diagnoses prior to evaluation: The emergent differential diagnosis includes, but is not limited to, complication of diverticulitis such as perforation, abscess, developing sepsis, versus electrolyte abnormality secondary to dehydration from ongoing GI losses. This is not an  exhaustive differential.   Past Medical History / Co-morbidities / Social History: CVA, MI, hypertension  Additional history: Chart reviewed. Pertinent results include: Extensively reviewed lab work, imaging from recent emergency department evaluation for acute diverticulitis  Physical Exam: Physical exam performed. The pertinent findings include: Some tenderness palpation remaining in the left lower quadrant, initially with some tachycardia which improved after fluids  Lab Tests/Imaging studies: I personally interpreted labs/imaging and the pertinent results include: CMP notable for  mild hypokalemia, testing 3.1, likely from ongoing GI losses, his total bilirubin is elevated at 1.8 likely secondary to dehydration, no right upper quadrant pain, low clinical suspicion for acute gallbladder pathology lipase normal.  UA unremarkable other than some protein.  CBC does have slightly worsening white count of 13.7 from recent hospitalization likely secondary to his ongoing diverticulitis..  Considered repeat CT of the abdomen but patient reports pain not worsening and his primary concern was the ongoing diarrhea, I think reasonable to treat diarrhea and monitor at this time before repeating a CT scan, but discussed if pain worsens may consider if the patient returns.    Medications: I ordered medication including Zofran, potassium, fluid bolus, I think that staying the course of his antibiotics is the appropriate treatment for his recently diagnosed uncomplicated diverticulitis, his primary concern is his ongoing frequent diarrhea and abdominal pain, will discharge with pain medication, nausea medication, and encourage close follow-up, patient understands and agrees to plan, discharged in stable condition, feeling improved on reevaluation evaluation in the ED.  I have reviewed the patients home medicines and have made adjustments as needed.   Disposition: After consideration of the diagnostic results and  the patients response to treatment, I feel that patient stable for discharge with plan as above.   emergency department workup does not suggest an emergent condition requiring admission or immediate intervention beyond what has been performed at this time. The plan is: as above. The patient is safe for discharge and has been instructed to return immediately for worsening symptoms, change in symptoms or any other concerns.  Final Clinical Impression(s) / ED Diagnoses Final diagnoses:  Diverticulitis  Diarrhea of infectious origin    Rx / DC Orders ED Discharge Orders          Ordered    ondansetron (ZOFRAN-ODT) 4 MG disintegrating tablet  Every 6 hours PRN        03/15/23 1504    oxyCODONE-acetaminophen (PERCOCET/ROXICET) 5-325 MG tablet  Every 6 hours PRN        03/15/23 1504              Kazim Corrales, [place] H, PA-C 03/15/23 1513    Alvira Monday, MD 03/15/23 2342

## 2023-03-15 NOTE — ED Triage Notes (Signed)
 C/o diarrhea x 2 weeks. Recently seen here for same and dx w/ diverticulitis. No relief or improvement.

## 2023-03-15 NOTE — Discharge Instructions (Addendum)
 You can use the stronger narcotic pain medication in place of Tylenol for severe break through pain / for your ongoing diarrhea up to every 6 hours.   You can also take the nausea medication up to every six hours as needed.   Please drink plenty of fluids including electrolyte containing fluids such as pedialyte, gatorade to help with dehydration.  Continue taking your antibiotics and follow up closely with your primary care doctor. Please return if your symptoms fail to improve despite treatment.

## 2023-03-15 NOTE — ED Notes (Signed)
 Pt is unable to give a urine sample at this time, will ask the pt again when he is in the exam room.

## 2023-03-18 ENCOUNTER — Telehealth: Payer: Self-pay

## 2023-03-18 NOTE — Transitions of Care (Post Inpatient/ED Visit) (Signed)
   03/18/2023  Name: Tommy Brooks MRN: [id number] DOB: 1950/08/10  Today's TOC FU Call Status: Today's TOC FU Call Status:: Unsuccessful Call (1st Attempt) Unsuccessful Call (1st Attempt) Date: 03/18/23  Attempted to reach the patient regarding the most recent Inpatient/ED visit.  Follow Up Plan: Additional outreach attempts will be made to reach the patient to complete the Transitions of Care (Post Inpatient/ED visit) call.   Signature Jodelle Green, RMA

## 2023-03-19 NOTE — Transitions of Care (Post Inpatient/ED Visit) (Signed)
   03/19/2023  Name: Tommy Brooks MRN: [id number] DOB: 06-22-1950  Today's TOC FU Call Status: Today's TOC FU Call Status:: Unsuccessful Call (2nd Attempt) Unsuccessful Call (1st Attempt) Date: 03/18/23 Unsuccessful Call (2nd Attempt) Date: 03/19/23  Attempted to reach the patient regarding the most recent Inpatient/ED visit.  Follow Up Plan: Additional outreach attempts will be made to reach the patient to complete the Transitions of Care (Post Inpatient/ED visit) call.   Signature  Jenny Reichmann

## 2023-03-22 NOTE — Transitions of Care (Post Inpatient/ED Visit) (Signed)
   03/22/2023  Name: GURSHAAN MATSUOKA MRN: [id number] DOB: January 31, 1950  Today's TOC FU Call Status: Today's TOC FU Call Status:: Unsuccessful Call (3rd Attempt) Unsuccessful Call (1st Attempt) Date: 03/18/23 Unsuccessful Call (2nd Attempt) Date: 03/19/23 Unsuccessful Call (3rd Attempt) Date: 03/22/23  Attempted to reach the patient regarding the most recent Inpatient/ED visit.  Follow Up Plan: No further outreach attempts will be made at this time. We have been unable to contact the patient.  Signature Jenny Reichmann

## 2023-03-25 NOTE — Transitions of Care (Post Inpatient/ED Visit) (Signed)
   03/25/2023  Name: JUWAUN INSKEEP MRN: [id number] DOB: [birth year]  Today's TOC FU Call Status: Today's TOC FU Call Status:: Unsuccessful Call (1st Attempt) Unsuccessful Call (1st Attempt) Date: 03/13/23  Attempted to reach the patient regarding the most recent Inpatient/ED visit.  Follow Up Plan: No further outreach attempts will be made at this time. We have been unable to contact the patient.  Signature Arvil Persons, BSN, Charity fundraiser

## 2023-03-26 ENCOUNTER — Inpatient Hospital Stay: Admitting: Family Medicine
